# Patient Record
Sex: Male | Born: 1945 | Race: White | Hispanic: No | Marital: Married | Smoking: Never smoker
Health system: Southern US, Community
[De-identification: ages and names within clinical notes are randomized; demographics above are authoritative.]

## PROBLEM LIST (undated history)

## (undated) DIAGNOSIS — E785 Hyperlipidemia, unspecified: Secondary | ICD-10-CM

## (undated) DIAGNOSIS — E669 Obesity, unspecified: Secondary | ICD-10-CM

## (undated) DIAGNOSIS — L03115 Cellulitis of right lower limb: Secondary | ICD-10-CM

## (undated) DIAGNOSIS — L02415 Cutaneous abscess of right lower limb: Secondary | ICD-10-CM

## (undated) DIAGNOSIS — I4891 Unspecified atrial fibrillation: Secondary | ICD-10-CM

## (undated) DIAGNOSIS — I10 Essential (primary) hypertension: Secondary | ICD-10-CM

## (undated) DIAGNOSIS — D472 Monoclonal gammopathy: Secondary | ICD-10-CM

## (undated) DIAGNOSIS — E1169 Type 2 diabetes mellitus with other specified complication: Secondary | ICD-10-CM

## (undated) HISTORY — PX: NASAL FRACTURE SURGERY: SHX718

## (undated) HISTORY — PX: APPENDECTOMY: SHX54

---

## 1998-02-27 HISTORY — PX: NEPHRECTOMY: SHX65

## 2014-02-15 ENCOUNTER — Emergency Department: Payer: Self-pay | Admitting: Student

## 2018-12-16 DIAGNOSIS — K2270 Barrett's esophagus without dysplasia: Secondary | ICD-10-CM | POA: Diagnosis not present

## 2019-10-09 DIAGNOSIS — D6859 Other primary thrombophilia: Secondary | ICD-10-CM | POA: Diagnosis not present

## 2019-10-09 DIAGNOSIS — G44319 Acute post-traumatic headache, not intractable: Secondary | ICD-10-CM | POA: Diagnosis not present

## 2019-10-09 DIAGNOSIS — E1122 Type 2 diabetes mellitus with diabetic chronic kidney disease: Secondary | ICD-10-CM | POA: Diagnosis not present

## 2019-12-10 ENCOUNTER — Other Ambulatory Visit: Payer: Self-pay

## 2019-12-10 ENCOUNTER — Observation Stay (HOSPITAL_COMMUNITY)
Admission: EM | Admit: 2019-12-10 | Discharge: 2019-12-12 | Disposition: A | Discharge status: Home / Self Care | Payer: No Typology Code available for payment source | Attending: Internal Medicine | Admitting: Internal Medicine

## 2019-12-10 DIAGNOSIS — E119 Type 2 diabetes mellitus without complications: Secondary | ICD-10-CM | POA: Diagnosis not present

## 2019-12-10 DIAGNOSIS — Z794 Long term (current) use of insulin: Secondary | ICD-10-CM | POA: Insufficient documentation

## 2019-12-10 DIAGNOSIS — R7402 Elevation of levels of lactic acid dehydrogenase (LDH): Secondary | ICD-10-CM | POA: Diagnosis not present

## 2019-12-10 DIAGNOSIS — Z20822 Contact with and (suspected) exposure to covid-19: Secondary | ICD-10-CM | POA: Insufficient documentation

## 2019-12-10 DIAGNOSIS — I130 Hypertensive heart and chronic kidney disease with heart failure and stage 1 through stage 4 chronic kidney disease, or unspecified chronic kidney disease: Secondary | ICD-10-CM | POA: Diagnosis not present

## 2019-12-10 DIAGNOSIS — L03115 Cellulitis of right lower limb: Principal | ICD-10-CM | POA: Insufficient documentation

## 2019-12-10 DIAGNOSIS — Z79899 Other long term (current) drug therapy: Secondary | ICD-10-CM | POA: Insufficient documentation

## 2019-12-10 DIAGNOSIS — E669 Obesity, unspecified: Secondary | ICD-10-CM | POA: Diagnosis present

## 2019-12-10 DIAGNOSIS — E872 Acidosis, unspecified: Secondary | ICD-10-CM | POA: Diagnosis present

## 2019-12-10 DIAGNOSIS — Z7901 Long term (current) use of anticoagulants: Secondary | ICD-10-CM | POA: Insufficient documentation

## 2019-12-10 DIAGNOSIS — R7989 Other specified abnormal findings of blood chemistry: Secondary | ICD-10-CM

## 2019-12-10 DIAGNOSIS — I4891 Unspecified atrial fibrillation: Secondary | ICD-10-CM | POA: Insufficient documentation

## 2019-12-10 DIAGNOSIS — L02415 Cutaneous abscess of right lower limb: Secondary | ICD-10-CM | POA: Diagnosis present

## 2019-12-10 DIAGNOSIS — N1832 Chronic kidney disease, stage 3b: Secondary | ICD-10-CM | POA: Diagnosis not present

## 2019-12-10 DIAGNOSIS — I5030 Unspecified diastolic (congestive) heart failure: Secondary | ICD-10-CM | POA: Diagnosis not present

## 2019-12-10 DIAGNOSIS — I872 Venous insufficiency (chronic) (peripheral): Secondary | ICD-10-CM | POA: Diagnosis not present

## 2019-12-10 DIAGNOSIS — D472 Monoclonal gammopathy: Secondary | ICD-10-CM | POA: Diagnosis present

## 2019-12-10 DIAGNOSIS — E1169 Type 2 diabetes mellitus with other specified complication: Secondary | ICD-10-CM | POA: Diagnosis present

## 2019-12-10 DIAGNOSIS — I10 Essential (primary) hypertension: Secondary | ICD-10-CM | POA: Diagnosis present

## 2019-12-10 DIAGNOSIS — E785 Hyperlipidemia, unspecified: Secondary | ICD-10-CM | POA: Diagnosis present

## 2019-12-10 DIAGNOSIS — Z6841 Body Mass Index (BMI) 40.0 and over, adult: Secondary | ICD-10-CM

## 2019-12-10 HISTORY — DX: Type 2 diabetes mellitus with other specified complication: E11.69

## 2019-12-10 HISTORY — DX: Monoclonal gammopathy: D47.2

## 2019-12-10 HISTORY — DX: Unspecified atrial fibrillation: I48.91

## 2019-12-10 HISTORY — DX: Morbid (severe) obesity due to excess calories: E66.01

## 2019-12-10 HISTORY — DX: Essential (primary) hypertension: I10

## 2019-12-10 HISTORY — DX: Hyperlipidemia, unspecified: E78.5

## 2019-12-10 HISTORY — DX: Obesity, unspecified: E66.9

## 2019-12-10 HISTORY — DX: Cellulitis of right lower limb: L03.115

## 2019-12-10 HISTORY — DX: Cutaneous abscess of right lower limb: L02.415

## 2019-12-10 LAB — COMPREHENSIVE METABOLIC PANEL
ALT: 20 U/L (ref 0–44)
AST: 26 U/L (ref 15–41)
Albumin: 3.1 g/dL — ABNORMAL LOW (ref 3.5–5.0)
Alkaline Phosphatase: 127 U/L — ABNORMAL HIGH (ref 38–126)
Anion gap: 11 (ref 5–15)
BUN: 22 mg/dL (ref 8–23)
CO2: 23 mmol/L (ref 22–32)
Calcium: 8.7 mg/dL — ABNORMAL LOW (ref 8.9–10.3)
Chloride: 101 mmol/L (ref 98–111)
Creatinine, Ser: 1.83 mg/dL — ABNORMAL HIGH (ref 0.61–1.24)
GFR, Estimated: 35 mL/min — ABNORMAL LOW (ref >60)
Glucose, Bld: 204 mg/dL — ABNORMAL HIGH (ref 70–99)
Potassium: 4.8 mmol/L (ref 3.5–5.1)
Sodium: 135 mmol/L (ref 135–145)
Total Bilirubin: 0.8 mg/dL (ref 0.3–1.2)
Total Protein: 6.8 g/dL (ref 6.5–8.1)

## 2019-12-10 LAB — CBC WITH DIFFERENTIAL/PLATELET
Abs Immature Granulocytes: 0.04 10*3/uL (ref 0.00–0.07)
Basophils Absolute: 0.1 10*3/uL (ref 0.0–0.1)
Basophils Relative: 1 %
Eosinophils Absolute: 0.3 10*3/uL (ref 0.0–0.5)
Eosinophils Relative: 3 %
HCT: 37.1 % — ABNORMAL LOW (ref 39.0–52.0)
Hemoglobin: 11.5 g/dL — ABNORMAL LOW (ref 13.0–17.0)
Immature Granulocytes: 1 %
Lymphocytes Relative: 11 %
Lymphs Abs: 0.9 10*3/uL (ref 0.7–4.0)
MCH: 29 pg (ref 26.0–34.0)
MCHC: 31 g/dL (ref 30.0–36.0)
MCV: 93.7 fL (ref 80.0–100.0)
Monocytes Absolute: 0.5 10*3/uL (ref 0.1–1.0)
Monocytes Relative: 5 %
Neutro Abs: 6.9 10*3/uL (ref 1.7–7.7)
Neutrophils Relative %: 79 %
Platelets: 187 10*3/uL (ref 150–400)
RBC: 3.96 MIL/uL — ABNORMAL LOW (ref 4.22–5.81)
RDW: 17.2 % — ABNORMAL HIGH (ref 11.5–15.5)
WBC: 8.7 10*3/uL (ref 4.0–10.5)
nRBC: 0 % (ref 0.0–0.2)

## 2019-12-10 LAB — URINALYSIS, ROUTINE W REFLEX MICROSCOPIC
Bilirubin Urine: NEGATIVE
Glucose, UA: NEGATIVE mg/dL
Hgb urine dipstick: NEGATIVE
Ketones, ur: NEGATIVE mg/dL
Nitrite: NEGATIVE
Protein, ur: NEGATIVE mg/dL
Specific Gravity, Urine: 1.006 (ref 1.005–1.030)
pH: 5 (ref 5.0–8.0)

## 2019-12-10 LAB — CBG MONITORING, ED: Glucose-Capillary: 176 mg/dL — ABNORMAL HIGH (ref 70–99)

## 2019-12-10 LAB — LACTIC ACID, PLASMA: Lactic Acid, Venous: 2.4 mmol/L (ref 0.5–1.9)

## 2019-12-10 NOTE — ED Triage Notes (Signed)
Pt reports swelling, redness, and pain to R leg since yesterday. Sent here by the [facility] for further eval of infection to same. Arrives with gauze wrap to LLE.

## 2019-12-11 ENCOUNTER — Encounter (HOSPITAL_COMMUNITY): Payer: Self-pay | Admitting: Internal Medicine

## 2019-12-11 ENCOUNTER — Emergency Department (HOSPITAL_COMMUNITY): Payer: No Typology Code available for payment source

## 2019-12-11 ENCOUNTER — Observation Stay (HOSPITAL_BASED_OUTPATIENT_CLINIC_OR_DEPARTMENT_OTHER): Payer: No Typology Code available for payment source

## 2019-12-11 DIAGNOSIS — L02415 Cutaneous abscess of right lower limb: Secondary | ICD-10-CM | POA: Diagnosis present

## 2019-12-11 DIAGNOSIS — E66813 Obesity, class 3: Secondary | ICD-10-CM

## 2019-12-11 DIAGNOSIS — L03115 Cellulitis of right lower limb: Secondary | ICD-10-CM | POA: Diagnosis not present

## 2019-12-11 DIAGNOSIS — E1169 Type 2 diabetes mellitus with other specified complication: Secondary | ICD-10-CM | POA: Diagnosis present

## 2019-12-11 DIAGNOSIS — Z6841 Body Mass Index (BMI) 40.0 and over, adult: Secondary | ICD-10-CM

## 2019-12-11 DIAGNOSIS — I739 Peripheral vascular disease, unspecified: Secondary | ICD-10-CM | POA: Diagnosis not present

## 2019-12-11 DIAGNOSIS — E872 Acidosis, unspecified: Secondary | ICD-10-CM | POA: Diagnosis present

## 2019-12-11 DIAGNOSIS — D472 Monoclonal gammopathy: Secondary | ICD-10-CM | POA: Diagnosis present

## 2019-12-11 DIAGNOSIS — E785 Hyperlipidemia, unspecified: Secondary | ICD-10-CM | POA: Diagnosis present

## 2019-12-11 DIAGNOSIS — I4891 Unspecified atrial fibrillation: Secondary | ICD-10-CM | POA: Diagnosis present

## 2019-12-11 DIAGNOSIS — I10 Essential (primary) hypertension: Secondary | ICD-10-CM | POA: Diagnosis present

## 2019-12-11 DIAGNOSIS — I872 Venous insufficiency (chronic) (peripheral): Secondary | ICD-10-CM | POA: Diagnosis present

## 2019-12-11 DIAGNOSIS — N1832 Chronic kidney disease, stage 3b: Secondary | ICD-10-CM

## 2019-12-11 HISTORY — DX: Chronic kidney disease, stage 3b: N18.32

## 2019-12-11 LAB — CBG MONITORING, ED: Glucose-Capillary: 203 mg/dL — ABNORMAL HIGH (ref 70–99)

## 2019-12-11 LAB — PROTIME-INR
INR: 2.5 — ABNORMAL HIGH (ref 0.8–1.2)
Prothrombin Time: 26.3 seconds — ABNORMAL HIGH (ref 11.4–15.2)

## 2019-12-11 LAB — RESPIRATORY PANEL BY RT PCR (FLU A&B, COVID)
Influenza A by PCR: NEGATIVE
Influenza B by PCR: NEGATIVE
SARS Coronavirus 2 by RT PCR: NEGATIVE

## 2019-12-11 LAB — GLUCOSE, CAPILLARY: Glucose-Capillary: 208 mg/dL — ABNORMAL HIGH (ref 70–99)

## 2019-12-11 LAB — LACTIC ACID, PLASMA
Lactic Acid, Venous: 4.9 mmol/L (ref 0.5–1.9)
Lactic Acid, Venous: 2.9 mmol/L (ref 0.5–1.9)

## 2019-12-11 MED ORDER — ACETAMINOPHEN 650 MG RE SUPP: 650.0000 mg | Freq: Four times a day (QID) | RECTAL | Status: DC | PRN

## 2019-12-11 MED ORDER — POLYVINYL ALCOHOL 1.4 % OP SOLN
1.0000 [drp] | Freq: Three times a day (TID) | OPHTHALMIC | Status: DC
  Administered 2019-12-12: 1 [drp] via OPHTHALMIC
  Filled 2019-12-11 (×2): qty 15

## 2019-12-11 MED ORDER — FINASTERIDE 5 MG PO TABS
5.0000 mg | ORAL_TABLET | Freq: Every day | ORAL | Status: DC
  Administered 2019-12-11 – 2019-12-12 (×2): 5 mg via ORAL
  Filled 2019-12-11 (×2): qty 1

## 2019-12-11 MED ORDER — CEPHALEXIN 500 MG PO CAPS: 500.0000 mg | ORAL_CAPSULE | Freq: Four times a day (QID) | ORAL | 0 refills | Status: DC

## 2019-12-11 MED ORDER — DOCUSATE SODIUM 100 MG PO CAPS
100.0000 mg | ORAL_CAPSULE | Freq: Two times a day (BID) | ORAL | Status: DC
  Administered 2019-12-11 – 2019-12-12 (×3): 100 mg via ORAL
  Filled 2019-12-11 (×3): qty 1

## 2019-12-11 MED ORDER — HYDRALAZINE HCL 20 MG/ML IJ SOLN: 5.0000 mg | INTRAMUSCULAR | Status: DC | PRN

## 2019-12-11 MED ORDER — ENOXAPARIN SODIUM 40 MG/0.4ML ~~LOC~~ SOLN: 40.0000 mg | SUBCUTANEOUS | Status: DC

## 2019-12-11 MED ORDER — MORPHINE SULFATE (PF) 2 MG/ML IV SOLN: 2.0000 mg | INTRAVENOUS | Status: DC | PRN

## 2019-12-11 MED ORDER — INSULIN ASPART PROT & ASPART (70-30 MIX) 100 UNIT/ML PEN
95.0000 [IU] | PEN_INJECTOR | Freq: Two times a day (BID) | SUBCUTANEOUS | Status: DC
  Filled 2019-12-11 (×2): qty 3

## 2019-12-11 MED ORDER — TRAMADOL HCL 50 MG PO TABS
50.0000 mg | ORAL_TABLET | Freq: Every day | ORAL | Status: DC
  Administered 2019-12-11 – 2019-12-12 (×2): 50 mg via ORAL
  Filled 2019-12-11 (×2): qty 1

## 2019-12-11 MED ORDER — METOPROLOL SUCCINATE ER 100 MG PO TB24
100.0000 mg | ORAL_TABLET | Freq: Every day | ORAL | Status: DC
  Administered 2019-12-11 – 2019-12-12 (×2): 100 mg via ORAL
  Filled 2019-12-11 (×2): qty 1

## 2019-12-11 MED ORDER — INSULIN ASPART 100 UNIT/ML ~~LOC~~ SOLN
0.0000 [IU] | Freq: Three times a day (TID) | SUBCUTANEOUS | Status: DC
  Administered 2019-12-11 – 2019-12-12 (×2): 7 [IU] via SUBCUTANEOUS
  Administered 2019-12-12: 4 [IU] via SUBCUTANEOUS

## 2019-12-11 MED ORDER — ALLOPURINOL 100 MG PO TABS
100.0000 mg | ORAL_TABLET | Freq: Two times a day (BID) | ORAL | Status: DC
  Administered 2019-12-11 – 2019-12-12 (×3): 100 mg via ORAL
  Filled 2019-12-11 (×4): qty 1

## 2019-12-11 MED ORDER — WARFARIN - PHARMACIST DOSING INPATIENT: Freq: Every day | Status: DC

## 2019-12-11 MED ORDER — CEPHALEXIN 250 MG PO CAPS: 500.0000 mg | ORAL_CAPSULE | Freq: Once | ORAL | Status: DC

## 2019-12-11 MED ORDER — LACTATED RINGERS IV SOLN: INTRAVENOUS | Status: DC

## 2019-12-11 MED ORDER — HYDROXYZINE HCL 50 MG PO TABS
100.0000 mg | ORAL_TABLET | ORAL | Status: DC | PRN
  Filled 2019-12-11: qty 2

## 2019-12-11 MED ORDER — AMIODARONE HCL 200 MG PO TABS
200.0000 mg | ORAL_TABLET | Freq: Every day | ORAL | Status: DC
  Administered 2019-12-11: 200 mg via ORAL
  Filled 2019-12-11: qty 1

## 2019-12-11 MED ORDER — SODIUM CHLORIDE 0.9 % IV BOLUS
1000.0000 mL | Freq: Once | INTRAVENOUS | Status: AC
  Administered 2019-12-11: 1000 mL via INTRAVENOUS

## 2019-12-11 MED ORDER — ONDANSETRON HCL 4 MG/2ML IJ SOLN: 4.0000 mg | Freq: Four times a day (QID) | INTRAMUSCULAR | Status: DC | PRN

## 2019-12-11 MED ORDER — WARFARIN SODIUM 5 MG PO TABS
5.0000 mg | ORAL_TABLET | Freq: Once | ORAL | Status: AC
  Administered 2019-12-11: 5 mg via ORAL
  Filled 2019-12-11: qty 1

## 2019-12-11 MED ORDER — ACETAMINOPHEN 325 MG PO TABS
650.0000 mg | ORAL_TABLET | Freq: Four times a day (QID) | ORAL | Status: DC | PRN
  Administered 2019-12-11: 650 mg via ORAL
  Filled 2019-12-11: qty 2

## 2019-12-11 MED ORDER — INSULIN ASPART 100 UNIT/ML ~~LOC~~ SOLN
0.0000 [IU] | Freq: Every day | SUBCUTANEOUS | Status: DC
  Administered 2019-12-11: 2 [IU] via SUBCUTANEOUS

## 2019-12-11 MED ORDER — OXYBUTYNIN CHLORIDE 5 MG PO TABS
5.0000 mg | ORAL_TABLET | Freq: Every day | ORAL | Status: DC
  Administered 2019-12-11 – 2019-12-12 (×2): 5 mg via ORAL
  Filled 2019-12-11 (×2): qty 1

## 2019-12-11 MED ORDER — CITALOPRAM HYDROBROMIDE 20 MG PO TABS
10.0000 mg | ORAL_TABLET | Freq: Every day | ORAL | Status: DC
  Administered 2019-12-11 – 2019-12-12 (×2): 10 mg via ORAL
  Filled 2019-12-11 (×2): qty 1

## 2019-12-11 MED ORDER — LOSARTAN POTASSIUM 25 MG PO TABS
25.0000 mg | ORAL_TABLET | Freq: Every day | ORAL | Status: DC
  Administered 2019-12-11: 25 mg via ORAL
  Filled 2019-12-11: qty 1

## 2019-12-11 MED ORDER — SODIUM CHLORIDE 0.9 % IV SOLN
1000.0000 mL | INTRAVENOUS | Status: DC
  Administered 2019-12-11 (×2): 1000 mL via INTRAVENOUS

## 2019-12-11 MED ORDER — ATORVASTATIN CALCIUM 80 MG PO TABS
80.0000 mg | ORAL_TABLET | Freq: Every day | ORAL | Status: DC
  Administered 2019-12-11: 80 mg via ORAL
  Filled 2019-12-11: qty 1

## 2019-12-11 MED ORDER — LORATADINE 10 MG PO TABS
10.0000 mg | ORAL_TABLET | Freq: Every day | ORAL | Status: DC
  Administered 2019-12-11 – 2019-12-12 (×2): 10 mg via ORAL
  Filled 2019-12-11 (×2): qty 1

## 2019-12-11 MED ORDER — ONDANSETRON HCL 4 MG PO TABS: 4.0000 mg | ORAL_TABLET | Freq: Four times a day (QID) | ORAL | Status: DC | PRN

## 2019-12-11 MED ORDER — SODIUM CHLORIDE 0.9 % IV SOLN
2.0000 g | INTRAVENOUS | Status: DC
  Administered 2019-12-11 – 2019-12-12 (×2): 2 g via INTRAVENOUS
  Filled 2019-12-11 (×2): qty 20

## 2019-12-11 MED ORDER — GABAPENTIN 400 MG PO CAPS
400.0000 mg | ORAL_CAPSULE | Freq: Every day | ORAL | Status: DC
  Administered 2019-12-11: 400 mg via ORAL
  Filled 2019-12-11: qty 1

## 2019-12-11 MED ORDER — INSULIN ASPART PROT & ASPART (70-30 MIX) 100 UNIT/ML ~~LOC~~ SUSP
95.0000 [IU] | Freq: Two times a day (BID) | SUBCUTANEOUS | Status: DC
  Administered 2019-12-12: 95 [IU] via SUBCUTANEOUS
  Filled 2019-12-11: qty 10

## 2019-12-11 MED ORDER — BISACODYL 5 MG PO TBEC: 5.0000 mg | DELAYED_RELEASE_TABLET | Freq: Every day | ORAL | Status: DC | PRN

## 2019-12-11 MED ORDER — POLYETHYLENE GLYCOL 3350 17 G PO PACK: 17.0000 g | PACK | Freq: Every day | ORAL | Status: DC | PRN

## 2019-12-11 MED ORDER — SODIUM CHLORIDE 0.9 % IV BOLUS (SEPSIS)
1000.0000 mL | Freq: Once | INTRAVENOUS | Status: AC
  Administered 2019-12-11: 1000 mL via INTRAVENOUS

## 2019-12-11 MED ORDER — TERAZOSIN HCL 5 MG PO CAPS
10.0000 mg | ORAL_CAPSULE | Freq: Every day | ORAL | Status: DC
  Administered 2019-12-11: 10 mg via ORAL
  Filled 2019-12-11 (×2): qty 2

## 2019-12-11 MED ORDER — HYDROCODONE-ACETAMINOPHEN 5-325 MG PO TABS: 1.0000 | ORAL_TABLET | ORAL | Status: DC | PRN

## 2019-12-11 NOTE — Progress Notes (Signed)
Notified provider of need to administer fluid bolus, pt needs 4311 cc.

## 2019-12-11 NOTE — Consult Note (Addendum)
[place] Nurse wound consult note Patient receiving care in [facility] ED [place] Consultation was completed remotely by review of records, and images and secure chat with Dr. Lorin Mercy Reason for Consult: B stasis dermatitis with RLE cellulitis Wound type: Venous insuffiency with bilateral LE hemosiderin staining. RLE is more severely impacted by venous insufficiency with scattered open areas and now with cellulitis Dressing procedure/placement/frequency: Clean RLE with soap and water, rinse and pat dry. Apply Xeroform Kellie Simmering # [id number]) to the open areas, wrap with kerlix then cover from toe to just below the knee with Ace Wrap Kellie Simmering # [phone]) Monitor the wound area(s) for worsening of condition such as: Signs/symptoms of infection, increase in size, development of or worsening of odor, development of pain, or increased pain at the affected locations.   Notify the medical team if any of these develop.  Thank you for the consult. [place] nurse will follow at this time. ABI study results to be obtained . [place] team will review when they are available. Please re-consult the [place] team if needed.  Cathlean Marseilles Tamala Julian, MSN, RN, [place], Lysle Pearl, [facility] Wound Treatment Associate Pager [phone]

## 2019-12-11 NOTE — Progress Notes (Signed)
Per ED MD giving fluids for ideal body weight

## 2019-12-11 NOTE — Progress Notes (Signed)
Notified bedside nurse of need to draw repeat lactic acid. 

## 2019-12-11 NOTE — Discharge Planning (Signed)
Pt active at [facility] MD: Scharlene Corn: Glean Hess   Pager: [phone] Desk phone: [phone]

## 2019-12-11 NOTE — H&P (Signed)
History and Physical    Alfred Meyers IOE:[id number] DOB: [birth year] DOA: 12/10/2019  PCP: [facility] Consultants:  [place]; Felton Clinton - oncology; Rayann Heman D for Coumadin; nephrology Patient coming from:  Home - lives with wife; NOK: Wife, [phone]  Chief Complaint: R leg cellulitis  HPI: Alfred Meyers is a 74 y.o. male with medical history significant of MGUS; HLD; depression; BPH; DM; HTN; and afib on Coumadin presenting with R leg cellulitis.  He came because of his right leg being infected. Symptoms started about 2 days ago.  No known trauma.  No fever.  +HA.  No h/o prior.    ED Course:  Carryover, per Dr. Tonie Griffith:  74 yo male with hx of DM, CHF, a-fib. Presents with cellulitis of leg. Lactic acid doubled on recheck and is 4.9. no signs of necrotizing fascitis on xray per ER doctor. Given Rocephin in ER.    Review of Systems: As per HPI; otherwise review of systems reviewed and negative.   Ambulatory Status:  Ambulates with a cane  COVID Vaccine Status:   Complete  Past Medical History:  Diagnosis Date  . Atrial fibrillation ([place])   . Cellulitis and abscess of right leg 10-/15/2021  . Diabetes mellitus type 2 in obese ([place])   . Dyslipidemia   . Hypertension   . MGUS (monoclonal gammopathy of unknown significance)   . Morbid obesity with BMI of 45.0-49.9, adult ([place])   . Stage 3b chronic kidney disease ([place]) 12/11/2019    Past Surgical History:  Procedure Laterality Date  . APPENDECTOMY    . NASAL FRACTURE SURGERY    . NEPHRECTOMY  2000   RCC    Social History   Socioeconomic History  . Marital status: Married    Spouse name: Not on file  . Number of children: Not on file  . Years of education: Not on file  . Highest education level: Not on file  Occupational History  . Not on file  Tobacco Use  . Smoking status: Never Smoker  . Smokeless tobacco: Never Used  Substance and Sexual Activity  . Alcohol use: Never  . Drug use: Never   . Sexual activity: Not on file  Other Topics Concern  . Not on file  Social History Narrative  . Not on file   Social Determinants of Health   Financial Resource Strain:   . Difficulty of Paying Living Expenses: Not on file  Food Insecurity:   . Worried About Charity fundraiser in the Last Year: Not on file  . Ran Out of Food in the Last Year: Not on file  Transportation Needs:   . Lack of Transportation (Medical): Not on file  . Lack of Transportation (Non-Medical): Not on file  Physical Activity:   . Days of Exercise per Week: Not on file  . Minutes of Exercise per Session: Not on file  Stress:   . Feeling of Stress : Not on file  Social Connections:   . Frequency of Communication with Friends and Family: Not on file  . Frequency of Social Gatherings with Friends and Family: Not on file  . Attends Religious Services: Not on file  . Active Member of Clubs or Organizations: Not on file  . Attends Archivist Meetings: Not on file  . Marital Status: Not on file  Intimate Partner Violence:   . Fear of Current or Ex-Partner: Not on file  . Emotionally Abused: Not on file  .  Physically Abused: Not on file  . Sexually Abused: Not on file    No Known Allergies  History reviewed. No pertinent family history.  Prior to Admission medications   Medication Sig Start Date End Date Taking? Authorizing Provider  cephALEXin (KEFLEX) 500 MG capsule Take 1 capsule (500 mg total) by mouth 4 (four) times daily for 10 days. 12/11/19 12/21/19  Fatima Blank, MD    Physical Exam: Vitals:   12/10/19 2244 12/11/19 0049 12/11/19 0230 12/11/19 0733  BP: (!) 167/70 (!) 160/54 (!) 156/69 133/67  Pulse: 79 88 79 77  Resp: 20 20  19   Temp: 97.7 F (36.5 C)   98.3 F (36.8 C)  TempSrc:    Oral  SpO2: 100% 98% 100% 98%  Weight:      Height:         . General:  Appears calm and comfortable and is NAD . Eyes:  PERRL, EOMI, normal lids, iris . ENT:  grossly normal  hearing, lips & tongue, mmm; poor dentition . Neck:  no LAD, masses or thyromegaly . Cardiovascular:  RRR, no m/r/g.  . Respiratory:   CTA bilaterally with no wheezes/rales/rhonchi.  Normal respiratory effort. . Abdomen:  soft, NT, ND, NABS, protuberant . Skin:  Hyperpigmentation of B LE with apparent B stasis dermatitis but the RLE is more erythematous and weeping purulent material on the medial-posterior side        . Musculoskeletal:  grossly normal tone BUE/BLE, good ROM, no bony abnormality . Psychiatric:  grossly normal mood and affect, speech fluent and appropriate, AOx3 Neurologic:  CN 2-12 grossly intact, moves all extremities in coordinated fashion   Radiological Exams on Admission: DG Tibia/Fibula Right  Result Date: 12/11/2019 CLINICAL DATA:  Cellulitis. EXAM: RIGHT TIBIA AND FIBULA - 2 VIEW COMPARISON:  None. FINDINGS: Diffuse edematous changes are present throughout the soft tissues lower extremity. Skin thickening is evident. Degenerative changes are noted at the knee and ankle without acute or focal osseous abnormalities otherwise. IMPRESSION: 1. Diffuse edematous changes throughout the soft tissues. 2. No acute or focal osseous abnormality. 3. Degenerative changes at the knee and ankle. Electronically Signed   By: San Morelle M.D.   On: 12/11/2019 05:31    EKG: not done   Labs on Admission: I have personally reviewed the available labs and imaging studies at the time of the admission.  Pertinent labs:   Glucose 204 BUN 22/Creatinine 1.83/GFR 35 - improved from 10/09/19 Lactate 2.4 -> 4.9 WBC 8.7 Hgb 11.5 INR 2.5 UA: small LE, rare bacteria   Assessment/Plan Principal Problem:   Cellulitis and abscess of right leg Active Problems:   Morbid obesity with BMI of 45.0-49.9, adult (HCC)   MGUS (monoclonal gammopathy of unknown significance)   Hypertension   Dyslipidemia   Diabetes mellitus type 2 in obese (HCC)   Atrial fibrillation (HCC)   Lactic  acidosis   Stasis dermatitis of both legs   Stage 3b chronic kidney disease ([place])    RLE Cellulitis -Patient without prior h/o cellulitis but obviously chronic B venous stasis with dermatitis presenting with worsening erythema to R lower leg with skin breakdown and weeping of purulent drainage -Normal WBC count -He was given Rocephin in the ER; will continue -If improved tomorrow, he likely can be discharged  B LE stasis dermatitis -Patient's wife has been wrapping both of his legs nightly with gauze and then ACE wraps -He would likely benefit from Unna boots to B LE but not for the  RLE until his cellulitis is improved -Wound care consult requested -ABIs ordered  Lactic acidosis -The patient is noted to have a lactate>4 (initial was lower).  -With the current information available to me, I don't think the patient is in septic shock.  -He has no SIRS criteria. -There is no evidence of necrotizing fasciitis on xray. -Will continue to trend the lactate to ensure clearance. -If continuing to increase, will consult surgery.  MGUS -Appears to have had abnormal bone marrow biopsy in 2019 -There are no obvious clinical notes from Dr. 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 -Weight loss should be encouraged -Outpatient PCP/bariatric medicine/bariatric surgery  f/u encouraged     Note: This patient has been tested and is negative for the novel coronavirus COVID-19. The patient has been fully vaccinated against COVID-19.    DVT prophylaxis:   Lovenox  Code Status:  Full - confirmed with patient/family Family Communication: Wife was present throughout evaluation Disposition Plan:  The patient is from: home  Anticipated d/c is to: home without [facility] services   Anticipated d/c date will depend on clinical response to treatment, but possibly as early as tomorrow if he has excellent response to treatment  Patient is currently: acutely ill Consults called: Wound care, Nutrition, TOC team Admission status:  It is my clinical opinion that referral for OBSERVATION is reasonable and necessary in this patient based on the above information provided. The aforementioned taken together are felt to place the patient at high risk for further clinical deterioration. However it is anticipated that the patient may be medically stable for discharge from the hospital within 24 to 48 hours.    Karmen Bongo MD [facility]   How to contact the [facility] Attending or Consulting provider [place] or covering provider during after hours [place], for this patient?  1. Check the care team in [facility] and look for a) attending/consulting TRH provider listed and b) the [facility] team listed 2. Log into [email or web address] and use 's universal password to access. If you do not have the password, please contact the hospital operator. 3. Locate the [facility] provider you are looking for under [facility] and page to a number that you can be directly reached. 4. If you still have difficulty reaching the provider, please page the [facility] (Director on Call) for the Hospitalists listed on amion for assistance.   12/11/2019, 9:44 AM

## 2019-12-11 NOTE — ED Notes (Signed)
Attempt report x1  

## 2019-12-11 NOTE — Progress Notes (Signed)
[place] for warfarin Indication: atrial fibrillation   Assessment: 84 yom with history of afib on warfarin PTA presenting with R leg cellulitis and started on ceftriaxone. Pharmacy consulted to dose warfarin inpatient. INR therapeutic at 2.5 on admit. Hg 11.5, plt wnl. No active bleed issues reported. Noted, patient continuing on PTA amiodarone inpatient. Lovenox prophylaxis ordered - not yet given.  PTA warfarin dose: 5mg  daily except 7mg  on Mon/Fri (last dose "past week" PTA per med rec)  Goal of Therapy:  INR 2-3 Monitor platelets by anticoagulation protocol: Yes   Plan:  D/c Lovenox prophylaxis with therapeutic INR Warfarin 5mg  PO x 1 dose Daily INR Monitor CBC, s/sx bleeding  Elicia Lamp, PharmD, BCPS Clinical Pharmacist 12/11/2019 10:29 AM

## 2019-12-11 NOTE — ED Provider Notes (Addendum)
[facility] EMERGENCY DEPARTMENT Provider Note  CSN: [id number] Arrival date & time: 12/10/19 1550  Chief Complaint(s) Cellulitis  HPI Alfred Meyers is a 74 y.o. male with a past medical history including hypertension, hyperlipidemia, diabetes, A. fib on Coumadin, diastolic heart failure on Lasix who presents to the emergency department with 2 days of gradually worsening right lower leg redness and pain.  Patient reports that he wraps his legs and unna boots due to his edema.  When he unwrapped his legs Tuesday he noted the redness of the right leg.  This is gradually worsened.  He went to the [place] who sent him here for further evaluation.  The patient denies any recent fevers or infections.  No coughing or congestion.  No chest pain or shortness of breath.  No other physical complaints.  HPI  Past Medical History No past medical history on file. There are no problems to display for this patient.  Home Medication(s)                                                                                   Past Surgical History ** The histories are not reviewed yet. Please review them in the "History" navigator section and refresh this [place]. Family History No family history on file.  Social History Social History   Tobacco Use  . Smoking status: Not on file  Substance Use Topics  . Alcohol use: Not on file  . Drug use: Not on file   Allergies Patient has no known allergies.  Review of Systems Review of Systems All other systems are reviewed and are negative for acute change except as noted in the HPI  Physical Exam Vital Signs  I have reviewed the triage vital signs BP (!) 156/69   Pulse 79   Temp 97.7 F (36.5 C)   Resp 20   Ht 5\' 9"  (1.753 m)   Wt (!) 144.7 kg   SpO2 100%   BMI 47.11 kg/m   Physical Exam Vitals reviewed.  Constitutional:      General: He is not in acute distress.    Appearance: He is well-developed. He is morbidly obese. He is  not diaphoretic.  HENT:     Head: Normocephalic and atraumatic.     Jaw: No trismus.     Right Ear: External ear normal.     Left Ear: External ear normal.     Nose: Nose normal.  Eyes:     General: No scleral icterus.    Conjunctiva/sclera: Conjunctivae normal.  Neck:     Trachea: Phonation normal.  Cardiovascular:     Rate and Rhythm: Normal rate and regular rhythm.  Pulmonary:     Effort: Pulmonary effort is normal. No respiratory distress.     Breath sounds: No stridor.  Abdominal:     General: There is no distension.  Musculoskeletal:        General: Normal range of motion.     Cervical back: Normal range of motion.     Right lower leg: 1+ Pitting Edema present.     Left lower leg: 1+ Pitting Edema present.     Comments: Chronic skin changes  to lower extremities due to edema.  Right lower leg with erythema from just above the ankle to proximal lower leg.  Patient has weeping and skin breakdown.  Area is tender to palpation.  See image  Neurological:     Mental Status: He is alert and oriented to person, place, and time.  Psychiatric:        Behavior: Behavior normal.           ED Results and Treatments Labs (all labs ordered are listed, but only abnormal results are displayed) Labs Reviewed  LACTIC ACID, PLASMA - Abnormal; Notable for the following components:      Result Value   Lactic Acid, Venous 2.4 (*)    All other components within normal limits  LACTIC ACID, PLASMA - Abnormal; Notable for the following components:   Lactic Acid, Venous 4.9 (*)    All other components within normal limits  COMPREHENSIVE METABOLIC PANEL - Abnormal; Notable for the following components:   Glucose, Bld 204 (*)    Creatinine, Ser 1.83 (*)    Calcium 8.7 (*)    Albumin 3.1 (*)    Alkaline Phosphatase 127 (*)    GFR, Estimated 35 (*)    All other components within normal limits  CBC WITH DIFFERENTIAL/PLATELET - Abnormal; Notable for the following components:   RBC 3.96  (*)    Hemoglobin 11.5 (*)    HCT 37.1 (*)    RDW 17.2 (*)    All other components within normal limits  URINALYSIS, ROUTINE W REFLEX MICROSCOPIC - Abnormal; Notable for the following components:   Leukocytes,Ua SMALL (*)    Bacteria, UA RARE (*)    All other components within normal limits  PROTIME-INR - Abnormal; Notable for the following components:   Prothrombin Time 26.3 (*)    INR 2.5 (*)    All other components within normal limits  CBG MONITORING, ED - Abnormal; Notable for the following components:   Glucose-Capillary 176 (*)    All other components within normal limits  CULTURE, BLOOD (ROUTINE X 2)  CULTURE, BLOOD (ROUTINE X 2)  RESPIRATORY PANEL BY RT PCR (FLU A&B, COVID)  LACTIC ACID, PLASMA  LACTIC ACID, PLASMA                                                                                                                         EKG  EKG Interpretation  Date/Time:    Ventricular Rate:    PR Interval:    QRS Duration:   QT Interval:    QTC Calculation:   R Axis:     Text Interpretation:        Radiology DG Tibia/Fibula Right  Result Date: 12/11/2019 CLINICAL DATA:  Cellulitis. EXAM: RIGHT TIBIA AND FIBULA - 2 VIEW COMPARISON:  None. FINDINGS: Diffuse edematous changes are present throughout the soft tissues lower extremity. Skin thickening is evident. Degenerative changes are noted at the knee and ankle without acute or focal osseous abnormalities otherwise. IMPRESSION:  1. Diffuse edematous changes throughout the soft tissues. 2. No acute or focal osseous abnormality. 3. Degenerative changes at the knee and ankle. Electronically Signed   By: San Morelle M.D.   On: 12/11/2019 05:31    Pertinent labs & imaging results that were available during my care of the patient were reviewed by me and considered in my medical decision making (see chart for details).  Medications Ordered in ED Medications  lactated ringers infusion (has no administration in time  range)  cefTRIAXone (ROCEPHIN) 2 g in sodium chloride 0.9 % 100 mL IVPB (2 g Intravenous New Bag/Given 12/11/19 0505)  sodium chloride 0.9 % bolus 1,000 mL (has no administration in time range)    Followed by  0.9 %  sodium chloride infusion (has no administration in time range)  sodium chloride 0.9 % bolus 1,000 mL (1,000 mLs Intravenous New Bag/Given 12/11/19 0503)                                                                                                                                    Procedures .Critical Care Performed by: Fatima Blank, MD Authorized by: Fatima Blank, MD   Critical care provider statement:    Critical care time (minutes):  45   Critical care was time spent personally by me on the following activities:  Discussions with consultants, evaluation of patient's response to treatment, examination of patient, ordering and performing treatments and interventions, ordering and review of laboratory studies, ordering and review of radiographic studies, pulse oximetry, re-evaluation of patient's condition, obtaining history from patient or surrogate and review of old charts    (including critical care time)  Medical Decision Making / ED Course I have reviewed the nursing notes for this encounter and the patient's prior records (if available in EHR or on provided paperwork).   Alfred Meyers was evaluated in Emergency Department on 12/11/2019 for the symptoms described in the history of present illness. He was evaluated in the context of the global COVID-19 pandemic, which necessitated consideration that the patient might be at risk for infection with the SARS-CoV-2 virus that causes COVID-19. Institutional protocols and algorithms that pertain to the evaluation of patients at risk for COVID-19 are in a state of rapid change based on information released by regulatory bodies including the CDC and federal and state organizations. These policies and algorithms  were followed during the patient's care in the ED.  Patient presents with right lower extremity cellulitis/erysipelas. CBC without leukocytosis. Renal insufficiency improved from prior. Lactic acid is slightly elevated at 2.4.  On review of records, noted that his last INR from August was supratherapeutic above 6.  Repeated here and noted to be 2.5.  He is afebrile with stable vital signs.  Discussed admission for IV antibiotics but patient initially declined.  Repeated lactic acid trending up and now greater than 4.  Patient updated on this change and was agreeable  to being admitted for IV antibiotics. Providing ideal body wt IVF due to obesity. Lactic acid is nonspecific. He does have a source of infection, but his other SIRS markers are reassuring.   We will obtain a plain film of the lower extremity to assess for any subcutaneous air that would be concerning for necrotizing fasciitis.      Plain film not concerning for Nec Fasc.    Will admit to Hospitalist team for further management.       Final Clinical Impression(s) / ED Diagnoses Final diagnoses:  Cellulitis of right leg  Elevated lactic acid level      This chart was dictated using voice recognition software.  Despite best efforts to proofread,  errors can occur which can change the documentation meaning.     Fatima Blank, MD 12/11/19 [phone]

## 2019-12-11 NOTE — Sepsis Progress Note (Signed)
Notified provider of need to order repeat lactic acid. And also notified that patient is due to receive more IVF resuscitation if appropriate.

## 2019-12-12 DIAGNOSIS — L03115 Cellulitis of right lower limb: Secondary | ICD-10-CM | POA: Diagnosis not present

## 2019-12-12 LAB — BASIC METABOLIC PANEL
Anion gap: 10 (ref 5–15)
BUN: 15 mg/dL (ref 8–23)
CO2: 25 mmol/L (ref 22–32)
Calcium: 8.6 mg/dL — ABNORMAL LOW (ref 8.9–10.3)
Chloride: 101 mmol/L (ref 98–111)
Creatinine, Ser: 1.5 mg/dL — ABNORMAL HIGH (ref 0.61–1.24)
GFR, Estimated: 45 mL/min — ABNORMAL LOW (ref >60)
Glucose, Bld: 210 mg/dL — ABNORMAL HIGH (ref 70–99)
Potassium: 4.3 mmol/L (ref 3.5–5.1)
Sodium: 136 mmol/L (ref 135–145)

## 2019-12-12 LAB — CBC
HCT: 37.3 % — ABNORMAL LOW (ref 39.0–52.0)
Hemoglobin: 12 g/dL — ABNORMAL LOW (ref 13.0–17.0)
MCH: 30.1 pg (ref 26.0–34.0)
MCHC: 32.2 g/dL (ref 30.0–36.0)
MCV: 93.5 fL (ref 80.0–100.0)
Platelets: 205 10*3/uL (ref 150–400)
RBC: 3.99 MIL/uL — ABNORMAL LOW (ref 4.22–5.81)
RDW: 17.2 % — ABNORMAL HIGH (ref 11.5–15.5)
WBC: 9.5 10*3/uL (ref 4.0–10.5)
nRBC: 0 % (ref 0.0–0.2)

## 2019-12-12 LAB — PROTIME-INR
INR: 2.5 — ABNORMAL HIGH (ref 0.8–1.2)
Prothrombin Time: 25.8 seconds — ABNORMAL HIGH (ref 11.4–15.2)

## 2019-12-12 LAB — GLUCOSE, CAPILLARY
Glucose-Capillary: 215 mg/dL — ABNORMAL HIGH (ref 70–99)
Glucose-Capillary: 195 mg/dL — ABNORMAL HIGH (ref 70–99)

## 2019-12-12 LAB — HEMOGLOBIN A1C
Hgb A1c MFr Bld: 6.9 % — ABNORMAL HIGH (ref 4.8–5.6)
Mean Plasma Glucose: 151 mg/dL

## 2019-12-12 MED ORDER — CEPHALEXIN 500 MG PO CAPS: 500.0000 mg | ORAL_CAPSULE | Freq: Three times a day (TID) | ORAL | 0 refills | Status: DC

## 2019-12-12 MED ORDER — CEPHALEXIN 500 MG PO CAPS
500.0000 mg | ORAL_CAPSULE | Freq: Three times a day (TID) | ORAL | Status: DC
  Administered 2019-12-12: 500 mg via ORAL
  Filled 2019-12-12: qty 1

## 2019-12-12 MED ORDER — WARFARIN SODIUM 5 MG PO TABS: 5.0000 mg | ORAL_TABLET | Freq: Once | ORAL | Status: DC

## 2019-12-12 MED ORDER — CEPHALEXIN 500 MG PO CAPS: 500.0000 mg | ORAL_CAPSULE | Freq: Three times a day (TID) | ORAL | 0 refills | Status: AC

## 2019-12-12 NOTE — Progress Notes (Signed)
Initial Nutrition Assessment  DOCUMENTATION CODES:   Morbid obesity  INTERVENTION:   -MVI with minerals daily -Ensure Max po BID, each supplement provides 150 kcal and 30 grams of protein  NUTRITION DIAGNOSIS:   Increased nutrient needs related to wound healing as evidenced by estimated needs.  GOAL:   Patient will meet greater than or equal to 90% of their needs  MONITOR:   PO intake, Supplement acceptance, Labs, Weight trends, Skin, I & O's  REASON FOR ASSESSMENT:   Consult Assessment of nutrition requirement/status  ASSESSMENT:   Alfred Meyers is a 74 y.o. male with medical history significant of MGUS; HLD; depression; BPH; DM; HTN; and afib on Coumadin presenting with R leg cellulitis.  He came because of his right leg being infected. Symptoms started about 2 days ago.  No known trauma.  No fever.  +HA.  No h/o prior.  Pt admitted with RLE cellulitis.   Reviewed I/O's: +3.4 L x 24 hours  Pt unavailable at time of visit.   No meal completion data available to assess at this time. Also no wt hx to assess at this time.   Pt with increased nutritional needs due to wound healing and would benefit from addition of oral nutrition supplements.   Medications reviewed and include colace.  Lab Results  Component Value Date   HGBA1C 6.9 (H) 12/11/2019   PTA DM medications are none.   Labs reviewed: CBGS: 203-208 (inpatient orders for glycemic control are 0-20 units TID with meals, 0-5 units insulin aspart daily at bedtime, and 95 units insulin aspart protamine- aspart BID with meals).   Diet Order:   Diet Order            Diet Carb Modified           Diet heart healthy/carb modified Room service appropriate? Yes; Fluid consistency: Thin  Diet effective now                 EDUCATION NEEDS:   No education needs have been identified at this time  Skin:  Skin Assessment: Skin Integrity Issues: Skin Integrity Issues:: Other (Comment) Other: RLE cellulitis and  stasis dermatitis  Last BM:  12/10/19  Height:   Ht Readings from Last 1 Encounters:  12/10/19 5\' 9"  (1.753 m)    Weight:   Wt Readings from Last 1 Encounters:  12/10/19 (!) 144.7 kg    Ideal Body Weight:  72.7 kg  BMI:  Body mass index is 47.11 kg/m.  Estimated Nutritional Needs:   Kcal:  2150-2350  Protein:  130-145 grams  Fluid:  > 2 L    Loistine Chance, RD, LDN, [place] Registered Dietitian II Certified Diabetes Care and Education Specialist Please refer to [facility] for RD and/or RD on-call/weekend/after hours pager

## 2019-12-12 NOTE — Discharge Instructions (Signed)

## 2019-12-12 NOTE — Discharge Summary (Signed)
[facility]  Physician Discharge Summary   Patient ID: Alfred Meyers MRN: [id number] DOB/AGE: [birth year] 76 y.o.  Admit date: 12/10/2019 Discharge date: 12/12/2019  PCP: [facility] [place]:  Right lower extremity cellulitis Bilateral lower extremity venous stasis dermatitis Chronic venous insufficiency MGUS History of essential hypertension Diabetes mellitus type 2 History of mood disorder History of hyperlipidemia BPH History of atrial fibrillation Chronic kidney disease stage IIIb Morbid obesity  RECOMMENDATIONS FOR OUTPATIENT FOLLOW UP: 1. Home health for wound care 2. Patient to follow-up with PCP at the [place]: RN for wound care Equipment/Devices: None  CODE STATUS: Full code  DISCHARGE CONDITION: fair  Diet recommendation: Modified carbohydrate  INITIAL HISTORY: 74 y.o. male with medical history significant of MGUS; HLD; depression; BPH; DM; HTN; and afib on Coumadin presented with R leg cellulitis.  He came because of his right leg being infected. Symptoms started about 2 days ago.  No known trauma.  No fever.  +HA.  No h/o prior.   Consultations:  None  Procedures:  None   HOSPITAL COURSE:    RLE Cellulitis -Patient without prior h/o cellulitis but obviously chronic B venous stasis with dermatitis presenting with worsening erythema to R lower leg with skin breakdown and weeping of purulent drainage -Normal WBC count -Patient was started on ceftriaxone. WBC remains normal.  He is afebrile.  He states that he is feeling better.  He says he wants to go home.  His wife is at the bedside. Since patient is otherwise doing well it is felt reasonable to discharge him at this time.  He was told about potential risk of worsening of his cellulitis. He was transitioned to Keflex which he tolerated well.  B LE stasis dermatitis -Patient's wife has been wrapping both of his legs nightly with gauze and then  ACE wraps Seen by wound care.  Home health ordered. ABIs were unremarkable both lower extremities.  Lactic acidosis -The patient is noted to have a lactate>4 (initial was lower).   However patient was noted to be quite stable.  Lactic acid trended and showed improvement.  MGUS -Appears to have had abnormal bone marrow biopsy in 2019 -There are no obvious clinical notes from Dr. 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  Essential hypertension -Continue Cozaar, Toprol XL  Diabetes mellitus type 2 HbA1c 6.9.  Resume home medications.  Mood d/o -Continue Celexa and prn Vistaril  HLD -Continue Lipitor  BPH -Continue Flomax, Proscar  History of chronic atrial fibrillation -Rate controlled with Amiodarone, Toprol XL, and pacemaker -Continue Coumadin.  Follow-up at the [facility] for PT/INR check  Stage 3b CKD -s/p L nephrectomy Renal function stable.  Creatinine better today compared to yesterday.  See labs below.  Morbid obesity Body mass index is 47.11 kg/m. -Weight loss should be encouraged -Outpatient PCP/bariatric medicine/bariatric surgery f/u encouraged   Patient very keen on going home today.  Apparently his wife does not have a way back and she has to take her medications which she has not taken in the last 2 days because she has been here with him.  Offered alternative transport to the wife which they declined.  They would rather go home.  Since he is otherwise stable he is okay for discharge.    PERTINENT LABS:  The results of significant diagnostics from  this hospitalization (including imaging, microbiology, ancillary and laboratory) are listed below for reference.    Microbiology: Recent Results (from the past 240 hour(s))  Blood culture (routine x 2)     Status: None (Preliminary result)   Collection Time: 12/11/19   5:00 AM   Specimen: BLOOD  Result Value Ref Range Status   Specimen Description BLOOD LEFT ANTECUBITAL  Final   Special Requests   Final    BOTTLES DRAWN AEROBIC AND ANAEROBIC Blood Culture adequate volume   Culture   Final    NO GROWTH < 24 HOURS Performed at [facility], [address], [place], [place] [address]    Report Status PENDING  Incomplete  Blood culture (routine x 2)     Status: None (Preliminary result)   Collection Time: 12/11/19  5:05 AM   Specimen: BLOOD  Result Value Ref Range Status   Specimen Description BLOOD RIGHT ANTECUBITAL  Final   Special Requests   Final    BOTTLES DRAWN AEROBIC AND ANAEROBIC Blood Culture adequate volume   Culture   Final    NO GROWTH < 24 HOURS Performed at [facility], [place] [address], [place], [place] [address]    Report Status PENDING  Incomplete  Respiratory Panel by RT PCR (Flu A&B, Covid) - Nasopharyngeal Swab     Status: None   Collection Time: 12/11/19  7:32 AM   Specimen: Nasopharyngeal Swab  Result Value Ref Range Status   SARS Coronavirus 2 by RT PCR NEGATIVE NEGATIVE Final    Comment: (NOTE) SARS-CoV-2 target nucleic acids are NOT DETECTED.  The SARS-CoV-2 RNA is generally detectable in upper respiratoy specimens during the acute phase of infection. The lowest concentration of SARS-CoV-2 viral copies this assay can detect is 131 copies/mL. A negative result does not preclude SARS-Cov-2 infection and should not be used as the sole basis for treatment or other patient management decisions. A negative result may occur with  improper specimen collection/handling, submission of specimen other than nasopharyngeal swab, presence of viral mutation(s) within the areas targeted by this assay, and inadequate number of viral copies (<131 copies/mL). A negative result must be combined with clinical observations, patient history, and epidemiological information. The expected result is Negative.  Fact Sheet for  Patients:  PinkCheek.be  Fact Sheet for Healthcare Providers:  GravelBags.it  This test is no t yet approved or cleared by the [place] FDA and  has been authorized for detection and/or diagnosis of SARS-CoV-2 by FDA under an Emergency Use Authorization (EUA). This EUA will remain  in effect (meaning this test can be used) for the duration of the COVID-19 declaration under Section 564(b)(1) of the Act, 21 U.S.C. section 360bbb-3(b)(1), unless the authorization is terminated or revoked sooner.     Influenza A by PCR NEGATIVE NEGATIVE Final   Influenza B by PCR NEGATIVE NEGATIVE Final    Comment: (NOTE) The Xpert Xpress SARS-CoV-2/FLU/RSV assay is intended as an aid in  the diagnosis of influenza from Nasopharyngeal swab specimens and  should not be used as a sole basis for treatment. Nasal washings and  aspirates are unacceptable for Xpert Xpress SARS-CoV-2/FLU/RSV  testing.  Fact Sheet for Patients: PinkCheek.be  Fact Sheet for Healthcare Providers: GravelBags.it  This test is not yet approved or cleared by the [place] FDA and  has been authorized for detection and/or diagnosis of SARS-CoV-2 by  FDA under an Emergency Use Authorization (EUA). This EUA will remain  in effect (meaning this test  can be used) for the duration of the  Covid-19 declaration under Section 564(b)(1) of the Act, 21  U.S.C. section 360bbb-3(b)(1), unless the authorization is  terminated or revoked. Performed at [facility], [place] [address], [place], [place] [address]      Labs:    Basic Metabolic Panel: Recent Labs  Lab 12/10/19 1626 12/12/19 0126  NA 135 136  K 4.8 4.3  CL 101 101  CO2 23 25  GLUCOSE 204* 210*  BUN 22 15  CREATININE 1.83* 1.50*  CALCIUM 8.7* 8.6*   Liver Function Tests: Recent Labs  Lab 12/10/19 1626  AST 26  ALT 20  ALKPHOS 127*   BILITOT 0.8  PROT 6.8  ALBUMIN 3.1*   CBC: Recent Labs  Lab 12/10/19 1626 12/12/19 0126  WBC 8.7 9.5  NEUTROABS 6.9  --   HGB 11.5* 12.0*  HCT 37.1* 37.3*  MCV 93.7 93.5  PLT 187 205    CBG: Recent Labs  Lab 12/10/19 1847 12/11/19 1218 12/11/19 2105 12/12/19 0826 12/12/19 1128  GLUCAP 176* 203* 208* 215* 195*     IMAGING STUDIES DG Tibia/Fibula Right  Result Date: 12/11/2019 CLINICAL DATA:  Cellulitis. EXAM: RIGHT TIBIA AND FIBULA - 2 VIEW COMPARISON:  None. FINDINGS: Diffuse edematous changes are present throughout the soft tissues lower extremity. Skin thickening is evident. Degenerative changes are noted at the knee and ankle without acute or focal osseous abnormalities otherwise. IMPRESSION: 1. Diffuse edematous changes throughout the soft tissues. 2. No acute or focal osseous abnormality. 3. Degenerative changes at the knee and ankle. Electronically Signed   By: San Morelle M.D.   On: 12/11/2019 05:31   VAS [place] ABI WITH/WO TBI  Result Date: 12/11/2019 LOWER EXTREMITY DOPPLER STUDY Indications: Claudication, and peripheral artery disease. High Risk Factors: Hypertension, hyperlipidemia, Diabetes.  Performing Technologist: Griffin Basil RCT RDMS  Examination Guidelines: A complete evaluation includes at minimum, Doppler waveform signals and systolic blood pressure reading at the level of bilateral brachial, anterior tibial, and posterior tibial arteries, when vessel segments are accessible. Bilateral testing is considered an integral part of a complete examination. Photoelectric Plethysmograph (PPG) waveforms and toe systolic pressure readings are included as required and additional duplex testing as needed. Limited examinations for reoccurring indications may be performed as noted.  ABI Findings: +---------+------------------+-----+---------+--------+ Right    Rt Pressure (mmHg)IndexWaveform Comment  +---------+------------------+-----+---------+--------+  Brachial 163                    triphasic         +---------+------------------+-----+---------+--------+ PTA      150               0.92                   +---------+------------------+-----+---------+--------+ PERO                            triphasic         +---------+------------------+-----+---------+--------+ DP       154               0.94 triphasic         +---------+------------------+-----+---------+--------+ Great Toe142               0.87 Normal            +---------+------------------+-----+---------+--------+ +---------+------------------+-----+---------+-------+ Left     Lt Pressure (mmHg)IndexWaveform Comment +---------+------------------+-----+---------+-------+ Brachial 156  triphasic        +---------+------------------+-----+---------+-------+ PTA      168               1.03                  +---------+------------------+-----+---------+-------+ PERO                            triphasic        +---------+------------------+-----+---------+-------+ DP       154               0.94 triphasic        +---------+------------------+-----+---------+-------+ Great Toe147               0.90 Normal           +---------+------------------+-----+---------+-------+ TOES Findings: +----------+---------------+--------+-------+ Right ToesPressure (mmHg)WaveformComment +----------+---------------+--------+-------+ 1st Digit 142            Normal          +----------+---------------+--------+-------+  +---------+---------------+--------+-------+ Left ToesPressure (mmHg)WaveformComment +---------+---------------+--------+-------+ 1st [id number]            Normal          +---------+---------------+--------+-------+    Summary: Right: Resting right ankle-brachial index is within normal range. No evidence of significant right lower extremity arterial disease. The right toe-brachial index is normal. Left: Resting left  ankle-brachial index is within normal range. No evidence of significant left lower extremity arterial disease. The left toe-brachial index is normal.  *See table(s) above for measurements and observations.  Electronically signed by Servando Snare MD on 12/11/2019 at 5:37:23 PM.    Final     DISCHARGE EXAMINATION: Vitals:   12/11/19 2027 12/11/19 2334 12/12/19 0406 12/12/19 1047  BP: (!) 158/85 134/67 120/65 123/68  Pulse: 77 76 70 70  Resp: 18 18 18 18   Temp: 98.1 F (36.7 C) 97.8 F (36.6 C) 97.6 F (36.4 C) 98.4 F (36.9 C)  TempSrc: Oral Oral Oral Oral  SpO2: 97% 94% 94% 95%  Weight:      Height:       General appearance: Awake alert.  In no distress Resp: Clear to auscultation bilaterally.  Normal effort Cardio: S1-S2 is normal regular.  No S3-S4.  No rubs murmurs or bruit GI: Abdomen is soft.  Nontender nondistended.  Bowel sounds are present normal.  No masses organomegaly Extremities: Right lower extremity covered in dressing.  Chronic venous stasis dermatitis noted of the left leg. Neurologic: Alert and oriented x3.  No focal neurological deficits.    DISPOSITION: Home with wife  Discharge Instructions    Call MD for:  difficulty breathing, headache or visual disturbances   Complete by: As directed    Call MD for:  extreme fatigue   Complete by: As directed    Call MD for:  hives   Complete by: As directed    Call MD for:  persistant dizziness or light-headedness   Complete by: As directed    Call MD for:  persistant nausea and vomiting   Complete by: As directed    Call MD for:  redness, tenderness, or signs of infection (pain, swelling, redness, odor or green/yellow discharge around incision site)   Complete by: As directed    Call MD for:  severe uncontrolled pain   Complete by: As directed    Call MD for:  temperature >100.4   Complete by: As directed    Diet Carb Modified  Complete by: As directed    Discharge instructions   Complete by: As directed     Please be sure to follow-up with your primary care provider within 1-2 weeks.  Seek attention immediately if your symptoms worsen or if you develop high fevers.  You were cared for by a hospitalist during your hospital stay. If you have any questions about your discharge medications or the care you received while you were in the hospital after you are discharged, you can call the unit and asked to speak with the hospitalist on call if the hospitalist that took care of you is not available. Once you are discharged, your primary care physician will handle any further medical issues. Please note that NO REFILLS for any discharge medications will be authorized once you are discharged, as it is imperative that you return to your primary care physician (or establish a relationship with a primary care physician if you do not have one) for your aftercare needs so that they can reassess your need for medications and monitor your lab values. If you do not have a primary care physician, you can call [phone] for a physician referral.   Discharge wound care:   Complete by: As directed    Clean RLE with soap and water, rinse and pat dry. Apply Xeroform gauze Kellie Simmering # 294) to the open areas, wrap with kerlix then cover from toe to just below the knee with Ace Wrap Kellie Simmering # [phone])   Increase activity slowly   Complete by: As directed        Allergies as of 12/12/2019   No Known Allergies     Medication List    TAKE these medications   allopurinol 100 MG tablet Commonly known as: ZYLOPRIM Take 100 mg by mouth 2 (two) times daily.   amiodarone 200 MG tablet Commonly known as: PACERONE Take 200 mg by mouth at bedtime.   ARTIFICIAL SALIVA MT Use as directed 4 sprays in the mouth or throat every hour as needed (dry mouth).   atorvastatin 80 MG tablet Commonly known as: LIPITOR Take 80 mg by mouth at bedtime.   cadexomer iodine 0.9 % gel Commonly known as: IODOSORB Apply 1 application topically See  admin instructions. Apply a small amount to affected area daily to left foot wound after cleansing foot with carra klens wound cleanser   cephALEXin 500 MG capsule Commonly known as: KEFLEX Take 1 capsule (500 mg total) by mouth every 8 (eight) hours for 7 days.   cetirizine 10 MG tablet Commonly known as: ZYRTEC Take 10 mg by mouth at bedtime.   citalopram 20 MG tablet Commonly known as: CELEXA Take 10 mg by mouth daily.   finasteride 5 MG tablet Commonly known as: PROSCAR Take 5 mg by mouth daily.   gabapentin 400 MG capsule Commonly known as: NEURONTIN Take 400 mg by mouth at bedtime.   hydrOXYzine 100 MG capsule Commonly known as: VISTARIL Take 100 mg by mouth as needed for itching or anxiety.   insulin aspart protamine - aspart (70-30) 100 UNIT/ML FlexPen Commonly known as: NOVOLOG 70/30 MIX Inject 95 Units into the skin in the morning and at bedtime.   lidocaine 5 % Commonly known as: LIDODERM Place 1 patch onto the skin daily. Remove & Discard patch within 12 hours or as directed by MD   liraglutide 18 MG/3ML Sopn Commonly known as: VICTOZA Inject 1 mg into the skin once a week. Verified ($RemoveBeforeD'1mg'[id number]$ ) weekly.   losartan 100 MG tablet  Commonly known as: COZAAR Take 25 mg by mouth at bedtime.   metoprolol 200 MG 24 hr tablet Commonly known as: TOPROL-XL Take 100 mg by mouth daily.   naloxone 4 MG/0.1ML Liqd nasal spray kit Commonly known as: NARCAN Place 1 spray into the nose once. For opioid overdose.   oxybutynin 5 MG tablet Commonly known as: DITROPAN Take 5 mg by mouth daily.   potassium chloride SA 20 MEQ tablet Commonly known as: KLOR-CON Take 40 mEq by mouth daily.   Propylene Glycol 0.6 % Soln Place 1 drop into both eyes in the morning, at noon, and at bedtime.   terazosin 10 MG capsule Commonly known as: HYTRIN Take 10 mg by mouth at bedtime.   torsemide 20 MG tablet Commonly known as: DEMADEX Take 20 mg by mouth daily.   traMADol 50 MG  tablet Commonly known as: ULTRAM Take 50 mg by mouth daily.   triamcinolone cream 0.1 % Commonly known as: KENALOG Apply 1 application topically 2 (two) times daily.   warfarin 5 MG tablet Commonly known as: COUMADIN Take 5-7.5 mg by mouth See admin instructions. 67m five (5) times per week on Tues, Wed, Thurs, Sat, Sun. 7.547mtwo (2) times per week on Mon and Friday. [facility]  ([facility], onward)         Start     Ordered   12/12/19 0000  Discharge wound care:       Comments: Clean RLE with soap and water, rinse and pat dry. Apply Xeroform gauze (LKellie Simmering 294) to the open areas, wrap with kerlix then cover from toe to just below the knee with Ace WrKevan NyLKellie Simmering [id number]  12/12/19 1257            Follow-up Information    Schedule an appointment as soon as possible for a visit  with [facility]   Contact information: [phone]             TOTAL DISCHARGE TIME: 3525inutes  [facility] Pager on [email or web address]  12/12/2019, 2:07 PM

## 2019-12-12 NOTE — Progress Notes (Signed)
[place] for warfarin Indication: atrial fibrillation   Assessment: 24 yom with history of afib on warfarin PTA presenting with R leg cellulitis and started on ceftriaxone. Pharmacy consulted to dose warfarin inpatient. INR therapeutic at 2.5 on admit. Last dose PTA was 10/11 PM, missed a dose on 10/12.   INR 2.5 stable. Noted, patient continuing on PTA amiodarone inpatient. Patient states eating much less here than at home.   PTA warfarin dose: 5mg  daily except 7.5mg  on Mon/Fri (Confirmed with patient, follows up at the [place] in [place])  Goal of Therapy:  INR 2-3 Monitor platelets by anticoagulation protocol: Yes   Plan:  Warfarin 5mg  x1  Monitor daily INR, CBC/plt Monitor for signs/symptoms of bleeding   Benetta Spar, PharmD, BCPS, BCCP Clinical Pharmacist  Please check AMION for all [place] phone numbers After 10:00 PM, call [place]

## 2019-12-12 NOTE — Plan of Care (Signed)
  Problem: Education: Goal: Knowledge of General Education information will improve Description Including pain rating scale, medication(s)/side effects and non-pharmacologic comfort measures Outcome: Progressing   

## 2019-12-12 NOTE — Progress Notes (Signed)
Reviewed discharge instructions with patient and spouse including wound care. Supplies given to patient enough for the weekend. RX of keflex also given. Pt and spouse both verbalize understanding of discharge instructions and [facility] with [facility] and [facility]. IV d/c with catheter in tact. 2x2 gauze applied. No bleeding noted at time of discharge. Pt discharged via wheelchair escorted by Nurse Judithann Sheen.

## 2019-12-12 NOTE — TOC Initial Note (Signed)
Transition of Care [facility]) - Initial/Assessment Note    Patient Details  Name: Alfred Meyers MRN: [id number] Date of Birth: Feb 21, 1946  Transition of Care [facility]) CM/SW Contact:    Marilu Favre, RN Phone Number: 12/12/2019, 3:01 PM  Clinical Narrative:                  Spoke to patient and wife Alfred Meyers at bedside. Confirmed face sheet information.   Patient's PCP DR Felton Clinton at [facility].Patient wants home health to go through [place].   Explained nurse at hospital will provide wound care education to patient and wife prior to discharge. HHRN will not be able to make daily visits. NCM will call and fax clinicals and home health orders to DR Felton Clinton at [place].   Patient's [place] [phone]. Paged awaiting call.   [facility] spoke with Mariann Laster Dr Felton Clinton fax number is [phone] . Clinicals and orders faxed . Patient has no preference for [facility] agency , referral given to Bea Graff cannot start care until receive [facility].  Patient and wife aware and voice understanding. Expected Discharge Plan: [place] Barriers to Discharge: No Barriers Identified   Patient Goals and CMS Choice Patient states their goals for this hospitalization and ongoing recovery are:: to return to home CMS Medicare.gov Compare Post Acute Care list provided to:: Patient Choice offered to / list presented to : Patient, Spouse  Expected Discharge Plan and Services Expected Discharge Plan: [place]   Discharge Planning Services: CM Consult Post Acute Care Choice: [place] arrangements for the past 2 months: Single Family Home Expected Discharge Date: 12/12/19               DME Arranged: N/A         HH Arranged: RN [place] Agency: [place] Date [facility] Agency Contacted: 12/12/19 Time HH Agency Contacted: 1500 Representative spoke with at [place]: Tommi Rumps need approval from the [place]  Prior Living Arrangements/Services Living  arrangements for the past 2 months: [place] Lives with:: Spouse Patient language and need for interpreter reviewed:: Yes Do you feel safe going back to the place where you live?: Yes      Need for Family Participation in Patient Care: Yes (Comment) Care giver support system in place?: Yes (comment)   Criminal Activity/Legal Involvement Pertinent to Current Situation/Hospitalization: No - Comment as needed  Activities of Daily Living Home Assistive Devices/Equipment: CBG Meter, Cane (specify quad or straight) ADL Screening (condition at time of admission) Patient's cognitive ability adequate to safely complete daily activities?: Yes Is the patient deaf or have difficulty hearing?: Yes Does the patient have difficulty seeing, even when wearing glasses/contacts?: No Does the patient have difficulty concentrating, remembering, or making decisions?: No Patient able to express need for assistance with ADLs?: Yes Does the patient have difficulty dressing or bathing?: No Independently performs ADLs?: Yes (appropriate for developmental age) Does the patient have difficulty walking or climbing stairs?: Yes Weakness of Legs: None Weakness of Arms/Hands: None  Permission Sought/Granted   Permission granted to share information with : Yes, Verbal Permission Granted  Share Information with NAME: wife           Emotional Assessment Appearance:: Appears stated age Attitude/Demeanor/Rapport: Engaged Affect (typically observed): Accepting Orientation: : Oriented to Self, Oriented to Place, Oriented to  Time, Oriented to Situation Alcohol / Substance Use: Not Applicable Psych Involvement: No (comment)  Admission diagnosis:  Cellulitis of right leg [L03.115] Elevated lactic acid level [R79.89] Cellulitis and abscess of right leg [L03.115, L02.415] Patient Active Problem List   Diagnosis Date Noted  . Cellulitis and abscess of right leg 12/11/2019  . Lactic acidosis 12/11/2019  .  Stasis dermatitis of both legs 12/11/2019  . Stage 3b chronic kidney disease ([place]) 12/11/2019  . Morbid obesity with BMI of 45.0-49.9, adult ([place])   . MGUS (monoclonal gammopathy of unknown significance)   . Hypertension   . Dyslipidemia   . Diabetes mellitus type 2 in obese ([place])   . Atrial fibrillation [facility])    PCP:  Clinic, [place]:   [place], [place] - [address] [place] [phone] [place] [address] Phone: [phone] Fax: [phone]     Social Determinants of Health (SDOH) Interventions    Readmission Risk Interventions No flowsheet data found.

## 2019-12-16 LAB — CULTURE, BLOOD (ROUTINE X 2)
Culture: NO GROWTH
Culture: NO GROWTH
Special Requests: ADEQUATE
Special Requests: ADEQUATE

## 2019-12-19 ENCOUNTER — Other Ambulatory Visit (HOSPITAL_COMMUNITY): Payer: Self-pay | Admitting: Student

## 2019-12-19 DIAGNOSIS — D472 Monoclonal gammopathy: Secondary | ICD-10-CM

## 2019-12-22 ENCOUNTER — Other Ambulatory Visit (HOSPITAL_COMMUNITY): Payer: Self-pay | Admitting: Student

## 2019-12-22 DIAGNOSIS — D472 Monoclonal gammopathy: Secondary | ICD-10-CM

## 2019-12-26 ENCOUNTER — Other Ambulatory Visit (HOSPITAL_COMMUNITY): Payer: Self-pay | Admitting: Student

## 2019-12-29 ENCOUNTER — Other Ambulatory Visit (HOSPITAL_COMMUNITY): Payer: Self-pay | Admitting: Internal Medicine

## 2019-12-29 DIAGNOSIS — D472 Monoclonal gammopathy: Secondary | ICD-10-CM

## 2020-01-12 ENCOUNTER — Other Ambulatory Visit: Payer: Self-pay | Admitting: Student

## 2020-01-13 ENCOUNTER — Ambulatory Visit (HOSPITAL_COMMUNITY)
Admission: RE | Admit: 2020-01-13 | Discharge: 2020-01-13 | Disposition: A | Discharge status: Home / Self Care | Payer: No Typology Code available for payment source | Source: Ambulatory Visit | Attending: Internal Medicine | Admitting: Internal Medicine

## 2020-01-13 ENCOUNTER — Ambulatory Visit (HOSPITAL_COMMUNITY): Payer: No Typology Code available for payment source

## 2020-01-13 ENCOUNTER — Encounter (HOSPITAL_COMMUNITY): Payer: Self-pay

## 2020-02-02 ENCOUNTER — Other Ambulatory Visit: Payer: Self-pay | Admitting: Radiology

## 2020-02-03 ENCOUNTER — Encounter (HOSPITAL_COMMUNITY): Payer: Self-pay

## 2020-02-03 ENCOUNTER — Ambulatory Visit (HOSPITAL_COMMUNITY)
Admission: RE | Admit: 2020-02-03 | Discharge: 2020-02-03 | Disposition: A | Discharge status: Home / Self Care | Payer: No Typology Code available for payment source | Source: Ambulatory Visit | Attending: Interventional Radiology | Admitting: Interventional Radiology

## 2020-02-03 ENCOUNTER — Other Ambulatory Visit: Payer: Self-pay

## 2020-02-03 ENCOUNTER — Ambulatory Visit (HOSPITAL_COMMUNITY)
Admission: RE | Admit: 2020-02-03 | Discharge: 2020-02-03 | Disposition: A | Discharge status: Home / Self Care | Payer: No Typology Code available for payment source | Source: Ambulatory Visit

## 2020-02-03 DIAGNOSIS — D649 Anemia, unspecified: Secondary | ICD-10-CM | POA: Diagnosis not present

## 2020-02-03 DIAGNOSIS — D472 Monoclonal gammopathy: Secondary | ICD-10-CM | POA: Diagnosis not present

## 2020-02-03 DIAGNOSIS — C9000 Multiple myeloma not having achieved remission: Secondary | ICD-10-CM | POA: Diagnosis present

## 2020-02-03 LAB — PROTIME-INR
INR: 1.6 — ABNORMAL HIGH (ref 0.8–1.2)
Prothrombin Time: 18.8 seconds — ABNORMAL HIGH (ref 11.4–15.2)

## 2020-02-03 LAB — CBC WITH DIFFERENTIAL/PLATELET
Abs Immature Granulocytes: 0.04 10*3/uL (ref 0.00–0.07)
Basophils Absolute: 0.1 10*3/uL (ref 0.0–0.1)
Basophils Relative: 1 %
Eosinophils Absolute: 0.3 10*3/uL (ref 0.0–0.5)
Eosinophils Relative: 4 %
HCT: 38.1 % — ABNORMAL LOW (ref 39.0–52.0)
Hemoglobin: 12.1 g/dL — ABNORMAL LOW (ref 13.0–17.0)
Immature Granulocytes: 0 %
Lymphocytes Relative: 16 %
Lymphs Abs: 1.5 10*3/uL (ref 0.7–4.0)
MCH: 29 pg (ref 26.0–34.0)
MCHC: 31.8 g/dL (ref 30.0–36.0)
MCV: 91.4 fL (ref 80.0–100.0)
Monocytes Absolute: 0.6 10*3/uL (ref 0.1–1.0)
Monocytes Relative: 7 %
Neutro Abs: 6.7 10*3/uL (ref 1.7–7.7)
Neutrophils Relative %: 72 %
Platelets: 179 10*3/uL (ref 150–400)
RBC: 4.17 MIL/uL — ABNORMAL LOW (ref 4.22–5.81)
RDW: 16.9 % — ABNORMAL HIGH (ref 11.5–15.5)
WBC: 9.2 10*3/uL (ref 4.0–10.5)
nRBC: 0 % (ref 0.0–0.2)

## 2020-02-03 LAB — GLUCOSE, CAPILLARY: Glucose-Capillary: 129 mg/dL — ABNORMAL HIGH (ref 70–99)

## 2020-02-03 MED ORDER — FENTANYL CITRATE (PF) 100 MCG/2ML IJ SOLN
INTRAMUSCULAR | Status: AC
  Filled 2020-02-03: qty 2

## 2020-02-03 MED ORDER — NALOXONE HCL 0.4 MG/ML IJ SOLN
INTRAMUSCULAR | Status: AC
  Filled 2020-02-03: qty 1

## 2020-02-03 MED ORDER — LIDOCAINE HCL (PF) 1 % IJ SOLN
INTRAMUSCULAR | Status: AC | PRN
  Administered 2020-02-03: 10 mL via INTRADERMAL

## 2020-02-03 MED ORDER — MIDAZOLAM HCL 2 MG/2ML IJ SOLN
INTRAMUSCULAR | Status: AC
  Filled 2020-02-03: qty 2

## 2020-02-03 MED ORDER — NALOXONE HCL 0.4 MG/ML IJ SOLN
INTRAMUSCULAR | Status: AC | PRN
  Administered 2020-02-03: 0.2 mg via INTRAVENOUS

## 2020-02-03 MED ORDER — FENTANYL CITRATE (PF) 100 MCG/2ML IJ SOLN
INTRAMUSCULAR | Status: AC | PRN
Start: 2020-02-03 — End: 2020-02-03
  Administered 2020-02-03 (×2): 50 ug via INTRAVENOUS

## 2020-02-03 MED ORDER — FLUMAZENIL 0.5 MG/5ML IV SOLN
INTRAVENOUS | Status: AC
  Filled 2020-02-03: qty 5

## 2020-02-03 MED ORDER — MIDAZOLAM HCL 2 MG/2ML IJ SOLN
INTRAMUSCULAR | Status: AC | PRN
  Administered 2020-02-03 (×3): 1 mg via INTRAVENOUS

## 2020-02-03 MED ORDER — SODIUM CHLORIDE 0.9 % IV SOLN: INTRAVENOUS | Status: DC

## 2020-02-03 NOTE — Sedation Documentation (Signed)
Patient's O2 92% but not responding to sternal rub. Dr Annamaria Boots notified by Jerilynn Mages. Smith, RT.

## 2020-02-03 NOTE — Progress Notes (Signed)
Pt stated he took his Coumadin yesterday (5mg ).  Informed PA and order received for stat INR/PT.  1020: results PT 18.8 / INR 1.6 informed PA Rushie Nyhan at 1030.

## 2020-02-03 NOTE — Sedation Documentation (Addendum)
Dr Annamaria Boots at bedside. No additional orders at this time. Patient responding to sternal rub. Opening eyes. Continuing to monitor closely.

## 2020-02-03 NOTE — Discharge Instructions (Signed)
Bone Marrow Aspiration and Bone Marrow Biopsy, Adult, Care After This sheet gives you information about how to care for yourself after your procedure. Your health care provider may also give you more specific instructions. If you have problems or questions, contact your health care provider. What can I expect after the procedure? After the procedure, it is common to have:  Mild pain and tenderness.  Swelling.  Bruising. Follow these instructions at home: Puncture site care   Follow instructions from your health care provider about how to take care of the puncture site. Make sure you: ? Wash your hands with soap and water before and after you change your bandage (dressing). If soap and water are not available, use hand sanitizer. ? Change your dressing as told by your health care provider.  Check your puncture site every day for signs of infection. Check for: ? More redness, swelling, or pain. ? Fluid or blood. ? Warmth. ? Pus or a bad smell. Activity  Return to your normal activities as told by your health care provider. Ask your health care provider what activities are safe for you.  Do not lift anything that is heavier than 10 lb (4.5 kg), or the limit that you are told, until your health care provider says that it is safe.  Do not drive for 24 hours if you were given a sedative during your procedure. General instructions   Take over-the-counter and prescription medicines only as told by your health care provider.  Do not take baths, swim, or use a hot tub until your health care provider approves. Ask your health care provider if you may take showers. You may only be allowed to take sponge baths.  If directed, put ice on the affected area. To do this: ? Put ice in a plastic bag. ? Place a towel between your skin and the bag. ? Leave the ice on for 20 minutes, 2-3 times a day.  Keep all follow-up visits as told by your health care provider. This is important. Contact a  health care provider if:  Your pain is not controlled with medicine.  You have a fever.  You have more redness, swelling, or pain around the puncture site.  You have fluid or blood coming from the puncture site.  Your puncture site feels warm to the touch.  You have pus or a bad smell coming from the puncture site. Summary  After the procedure, it is common to have mild pain, tenderness, swelling, and bruising.  Follow instructions from your health care provider about how to take care of the puncture site and what activities are safe for you.  Take over-the-counter and prescription medicines only as told by your health care provider.  Contact a health care provider if you have any signs of infection, such as fluid or blood coming from the puncture site. This information is not intended to replace advice given to you by your health care provider. Make sure you discuss any questions you have with your health care provider. Document Revised: 07/02/2018 Document Reviewed: 07/02/2018 Elsevier Patient Education  2020 Elsevier Inc. Moderate Conscious Sedation, Adult, Care After These instructions provide you with information about caring for yourself after your procedure. Your health care provider may also give you more specific instructions. Your treatment has been planned according to current medical practices, but problems sometimes occur. Call your health care provider if you have any problems or questions after your procedure. What can I expect after the procedure? After your procedure,   it is common:  To feel sleepy for several hours.  To feel clumsy and have poor balance for several hours.  To have poor judgment for several hours.  To vomit if you eat too soon. Follow these instructions at home: For at least 24 hours after the procedure:   Do not: ? Participate in activities where you could fall or become injured. ? Drive. ? Use heavy machinery. ? Drink alcohol. ? Take  sleeping pills or medicines that cause drowsiness. ? Make important decisions or sign legal documents. ? Take care of children on your own.  Rest. Eating and drinking  Follow the diet recommended by your health care provider.  If you vomit: ? Drink water, juice, or soup when you can drink without vomiting. ? Make sure you have little or no nausea before eating solid foods. General instructions  Have a responsible adult stay with you until you are awake and alert.  Take over-the-counter and prescription medicines only as told by your health care provider.  If you smoke, do not smoke without supervision.  Keep all follow-up visits as told by your health care provider. This is important. Contact a health care provider if:  You keep feeling nauseous or you keep vomiting.  You feel light-headed.  You develop a rash.  You have a fever. Get help right away if:  You have trouble breathing. This information is not intended to replace advice given to you by your health care provider. Make sure you discuss any questions you have with your health care provider. Document Revised: 01/26/2017 Document Reviewed: 06/05/2015 Elsevier Patient Education  2020 Elsevier Inc.  

## 2020-02-03 NOTE — Sedation Documentation (Signed)
Patient responding to voice. Opening eyes and nodding when asked if he is feeling ok.

## 2020-02-03 NOTE — H&P (Signed)
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 Request is for bone marrow biopsy  Referring Physician(s): Agnew,Elizabeth  Supervising Physician: Daryll Brod  Patient Status: [facility] - Out-pt  History of Present Illness: Alfred Meyers is a 74 y.o. male 74 y.o. male [place] outpatient. History of HTN, DM,  a fib, HLD MGUS found to have increase in BMI greater than normal. . Team is requesting a bone marrow biopsy for further evaluation of progression to myeloma.   Past Medical History:  Diagnosis Date  . Atrial fibrillation ([place])   . Cellulitis and abscess of right leg 10-/15/2021  . Diabetes mellitus type 2 in obese ([place])   . Dyslipidemia   . Hypertension   . MGUS (monoclonal gammopathy of unknown significance)   . Morbid obesity with BMI of 45.0-49.9, adult ([place])   . 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 APPENDECTOMY    . NASAL FRACTURE SURGERY    . NEPHRECTOMY  2000   RCC    Allergies: Patient has no known allergies.  Medications: Prior to Admission medications   Medication Sig Start Date End Date Taking? Authorizing Provider  allopurinol (ZYLOPRIM) 100 MG tablet Take 100 mg by mouth 2 (two) times daily.    Yes [provider]  amiodarone (PACERONE) 200 MG tablet Take 200 mg by mouth at bedtime.   Yes [provider]  atorvastatin (LIPITOR) 80 MG tablet Take 80 mg by mouth at bedtime.   Yes [provider]  cetirizine (ZYRTEC) 10 MG tablet Take 10 mg by mouth at bedtime.   Yes [provider]  citalopram (CELEXA) 20 MG tablet Take 10 mg by mouth daily.    Yes [provider]  finasteride (PROSCAR) 5 MG tablet Take 5 mg by mouth daily.   Yes [provider]  gabapentin (NEURONTIN) 400 MG capsule Take 400 mg by mouth at bedtime.   Yes [provider]  hydrOXYzine (VISTARIL) 100 MG capsule Take 100 mg by mouth as needed for  itching or anxiety.   Yes [provider]  insulin aspart protamine - aspart (NOVOLOG 70/30 MIX) (70-30) 100 UNIT/ML FlexPen Inject 95 Units into the skin in the morning and at bedtime.   Yes [provider]  liraglutide (VICTOZA) 18 MG/3ML SOPN Inject 1 mg into the skin once a week. Verified (80m) weekly.   Yes [provider]  losartan (COZAAR) 100 MG tablet Take 25 mg by mouth at bedtime.    Yes [provider]  metoprolol (TOPROL-XL) 200 MG 24 hr tablet Take 100 mg by mouth daily.    Yes [provider]  oxybutynin (DITROPAN) 5 MG tablet Take 5 mg by mouth daily.   Yes [provider]  potassium chloride SA (KLOR-CON) 20 MEQ tablet Take 40 mEq by mouth daily.   Yes [provider]  terazosin (HYTRIN) 10 MG capsule Take 10 mg by mouth at bedtime.   Yes [provider]  torsemide (DEMADEX) 20 MG tablet Take 20 mg by mouth daily.   Yes [provider]  traMADol (ULTRAM) 50 MG tablet Take 50 mg by mouth daily.   Yes [provider]  triamcinolone cream (KENALOG) 0.1 % Apply 1 application topically 2 (two) times daily.   Yes [provider]  warfarin (COUMADIN) 5 MG tablet Take 5-7.5 mg by mouth See admin instructions. 558mfive (5) times per week on Tues, Wed, Thurs,  Sat, Sun. 7.24m two (2) times per week on Mon and Friday. [facility] [place]  Yes [provider]  ARTIFICIAL SALIVA MT Use as directed 4 sprays in the mouth or throat every hour as needed (dry mouth).    [provider]  cadexomer iodine (IODOSORB) 0.9 % gel Apply 1 application topically See admin instructions. Apply a small amount to affected area daily to left foot wound after cleansing foot with carra klens wound cleanser    [provider]  lidocaine (LIDODERM) 5 % Place 1 patch onto the skin daily. Remove & Discard patch within 12 hours or as directed by MD    [provider]   naloxone (NARCAN) nasal spray 4 mg/0.1 mL Place 1 spray into the nose once. For opioid overdose.    [provider]  Propylene Glycol 0.6 % SOLN Place 1 drop into both eyes in the morning, at noon, and at bedtime.    [provider]     History reviewed. No pertinent family history.  Social History   Socioeconomic History  . Marital status: Married    Spouse name: Not on file  . Number of children: Not on file  . Years of education: Not on file  . Highest education level: Not on file  Occupational History  . Not on file  Tobacco Use  . Smoking status: Never Smoker  . Smokeless tobacco: Never Used  Substance and Sexual Activity  . Alcohol use: Never  . Drug use: Never  . Sexual activity: Not on file  Other Topics Concern  . Not on file  Social History Narrative  . Not on file   Social Determinants of Health   Financial Resource Strain:   . Difficulty of Paying Living Expenses: Not on file  Food Insecurity:   . Worried About RCharity fundraiserin the Last Year: Not on file  . Ran Out of Food in the Last Year: Not on file  Transportation Needs:   . Lack of Transportation (Medical): Not on file  . Lack of Transportation (Non-Medical): Not on file  Physical Activity:   . Days of Exercise per Week: Not on file  . Minutes of Exercise per Session: Not on file  Stress:   . Feeling of Stress : Not on file  Social Connections:   . Frequency of Communication with Friends and Family: Not on file  . Frequency of Social Gatherings with Friends and Family: Not on file  . Attends Religious Services: Not on file  . Active Member of Clubs or Organizations: Not on file  . Attends CArchivistMeetings: Not on file  . Marital Status: Not on file     Review of Systems: A 12 point ROS discussed and pertinent positives are indicated in the HPI above.  All other systems are negative.  Review of Systems  Constitutional: Negative for fever.  HENT: Negative  for congestion.   Respiratory: Positive for cough. Negative for shortness of breath.   Cardiovascular: Negative for chest pain.  Gastrointestinal: Negative for abdominal pain.  Neurological: Negative for headaches.  Psychiatric/Behavioral: Negative for behavioral problems and confusion.    Vital Signs: BP (!) 142/85   Pulse 78   Temp 97.6 F (36.4 C) (Oral)   Resp 20   Ht 5' 9" (1.753 m)   Wt (!) 320 lb (145.2 kg)   SpO2 95%   BMI 47.26 kg/m   Physical Exam Vitals and nursing  note reviewed.  Constitutional:      Appearance: He is well-developed.  HENT:     Head: Normocephalic.  Cardiovascular:     Rate and Rhythm: Normal rate. Rhythm irregular.     Heart sounds: Normal heart sounds.  Pulmonary:     Effort: Pulmonary effort is normal.     Breath sounds: Normal breath sounds.  Musculoskeletal:        General: Normal range of motion.     Cervical back: Normal range of motion.  Skin:    General: Skin is dry.  Neurological:     Mental Status: He is alert and oriented to person, place, and time.     Imaging: No results found.  Labs:  CBC: Recent Labs    12/10/19 1626 12/12/19 0126 02/03/20 0950  WBC 8.7 9.5 9.2  HGB 11.5* 12.0* 12.1*  HCT 37.1* 37.3* 38.1*  PLT 187 205 179    COAGS: Recent Labs    12/11/19 0318 12/12/19 0126  INR 2.5* 2.5*    BMP: Recent Labs    12/10/19 1626 12/12/19 0126  NA 135 136  K 4.8 4.3  CL 101 101  CO2 23 25  GLUCOSE 204* 210*  BUN 22 15  CALCIUM 8.7* 8.6*  CREATININE 1.83* 1.50*  GFRNONAA 35* 45*    LIVER FUNCTION TESTS: Recent Labs    12/10/19 1626  BILITOT 0.8  AST 26  ALT 20  ALKPHOS 127*  PROT 6.8  ALBUMIN 3.1*    TUMOR MARKERS: No results for input(s): AFPTM, CEA, CA199, CHROMGRNA in the last 8760 hours.  Assessment and Plan:  74 y.o. male [place] outpatient. History of HTN, DM,  a fib, HLD MGUS found to have elevated BMI . Team is requesting a bone marrow biopsy for further evaluation of  progression to myeloma.   All labs are within acceptable parameters.  Patient is on Coumadin for a fib. NKDA. Patient has been NPO since midnight.   Risks and benefits of bone marrow biopsy was discussed with the patient and/or patient's family including, but not limited to bleeding, infection, damage to adjacent structures or low yield requiring additional tests.  All of the questions were answered and there is agreement to proceed.  Consent signed and in chart.    Thank you for this interesting consult.  I greatly enjoyed meeting Alfred Meyers and look forward to participating in their care.  A copy of this report was sent to the requesting provider on this date.  Electronically Signed: Jacqualine Mau, NP 02/03/2020, 10:18 AM   I spent a total of  30 Minutes   in face to face in clinical consultation, greater than 50% of which was counseling/coordinating care for bone marrow biopsy

## 2020-02-03 NOTE — Procedures (Signed)
Interventional Radiology Procedure Note  Procedure: CT RT ILIAC BM ASP AND CORE BX    Complications: None  Estimated Blood Loss:  MIN  Findings: 11 G CORE AND ASP    M. Daryll Brod, MD

## 2020-02-05 LAB — SURGICAL PATHOLOGY

## 2020-02-11 ENCOUNTER — Encounter (HOSPITAL_COMMUNITY): Payer: Self-pay | Admitting: Internal Medicine

## 2020-02-16 ENCOUNTER — Encounter (HOSPITAL_COMMUNITY): Payer: Self-pay | Admitting: Internal Medicine

## 2020-04-19 DIAGNOSIS — Z20822 Contact with and (suspected) exposure to covid-19: Secondary | ICD-10-CM | POA: Diagnosis not present

## 2020-04-19 DIAGNOSIS — N390 Urinary tract infection, site not specified: Secondary | ICD-10-CM | POA: Diagnosis not present

## 2020-04-19 DIAGNOSIS — I10 Essential (primary) hypertension: Secondary | ICD-10-CM | POA: Diagnosis not present

## 2020-04-19 DIAGNOSIS — E1165 Type 2 diabetes mellitus with hyperglycemia: Secondary | ICD-10-CM | POA: Diagnosis not present

## 2020-04-19 DIAGNOSIS — M791 Myalgia, unspecified site: Secondary | ICD-10-CM | POA: Diagnosis not present

## 2020-04-19 DIAGNOSIS — R0902 Hypoxemia: Secondary | ICD-10-CM | POA: Diagnosis not present

## 2020-04-19 DIAGNOSIS — R609 Edema, unspecified: Secondary | ICD-10-CM | POA: Diagnosis not present

## 2020-04-19 DIAGNOSIS — R58 Hemorrhage, not elsewhere classified: Secondary | ICD-10-CM | POA: Diagnosis not present

## 2020-04-19 DIAGNOSIS — R319 Hematuria, unspecified: Secondary | ICD-10-CM | POA: Diagnosis not present

## 2020-04-19 DIAGNOSIS — I509 Heart failure, unspecified: Secondary | ICD-10-CM | POA: Diagnosis not present

## 2020-06-26 ENCOUNTER — Emergency Department (HOSPITAL_COMMUNITY)
Admission: EM | Admit: 2020-06-26 | Discharge: 2020-06-26 | Disposition: A | Discharge status: Home / Self Care | Payer: No Typology Code available for payment source | Attending: Emergency Medicine | Admitting: Emergency Medicine

## 2020-06-26 ENCOUNTER — Encounter (HOSPITAL_COMMUNITY): Payer: Self-pay | Admitting: Emergency Medicine

## 2020-06-26 ENCOUNTER — Emergency Department (HOSPITAL_COMMUNITY): Payer: No Typology Code available for payment source

## 2020-06-26 ENCOUNTER — Other Ambulatory Visit: Payer: Self-pay

## 2020-06-26 DIAGNOSIS — Y92410 Unspecified street and highway as the place of occurrence of the external cause: Secondary | ICD-10-CM | POA: Insufficient documentation

## 2020-06-26 DIAGNOSIS — Z79899 Other long term (current) drug therapy: Secondary | ICD-10-CM | POA: Diagnosis not present

## 2020-06-26 DIAGNOSIS — Z7901 Long term (current) use of anticoagulants: Secondary | ICD-10-CM | POA: Diagnosis not present

## 2020-06-26 DIAGNOSIS — S81812A Laceration without foreign body, left lower leg, initial encounter: Secondary | ICD-10-CM | POA: Diagnosis not present

## 2020-06-26 DIAGNOSIS — E1122 Type 2 diabetes mellitus with diabetic chronic kidney disease: Secondary | ICD-10-CM | POA: Diagnosis not present

## 2020-06-26 DIAGNOSIS — R519 Headache, unspecified: Secondary | ICD-10-CM | POA: Insufficient documentation

## 2020-06-26 DIAGNOSIS — S39012A Strain of muscle, fascia and tendon of lower back, initial encounter: Secondary | ICD-10-CM | POA: Insufficient documentation

## 2020-06-26 DIAGNOSIS — Z794 Long term (current) use of insulin: Secondary | ICD-10-CM | POA: Diagnosis not present

## 2020-06-26 DIAGNOSIS — I129 Hypertensive chronic kidney disease with stage 1 through stage 4 chronic kidney disease, or unspecified chronic kidney disease: Secondary | ICD-10-CM | POA: Diagnosis not present

## 2020-06-26 DIAGNOSIS — S0031XA Abrasion of nose, initial encounter: Secondary | ICD-10-CM | POA: Diagnosis not present

## 2020-06-26 DIAGNOSIS — N1832 Chronic kidney disease, stage 3b: Secondary | ICD-10-CM | POA: Insufficient documentation

## 2020-06-26 DIAGNOSIS — I4891 Unspecified atrial fibrillation: Secondary | ICD-10-CM | POA: Insufficient documentation

## 2020-06-26 DIAGNOSIS — S8992XA Unspecified injury of left lower leg, initial encounter: Secondary | ICD-10-CM | POA: Diagnosis present

## 2020-06-26 MED ORDER — ACETAMINOPHEN 325 MG PO TABS: 650.0000 mg | ORAL_TABLET | Freq: Four times a day (QID) | ORAL | 0 refills | Status: AC | PRN

## 2020-06-26 MED ORDER — CYCLOBENZAPRINE HCL 10 MG PO TABS: 10.0000 mg | ORAL_TABLET | Freq: Every evening | ORAL | 0 refills | Status: DC | PRN

## 2020-06-26 NOTE — Discharge Instructions (Addendum)
Please keep the bandages on your left leg for 2 days.  Afterwards you can take off the bandage and shower or bathe normally with soap and water.  You will likely have back and neck soreness and muscle spasms for the next week.  I prescribed you Flexeril, which is a muscle relaxer.  This medicine may make you sleepy.  It may make you dizzy. Try to take it only at night before bedtime, IF NEEDED.   You can also take Tylenol for pain regularly.  I prescribed both to your pharmacy at [facility] in [place], [place].  We talked about the "spot" on your lumbar spine (lower back) on your CT scan.  This may be benign, but it may also be cancer.  Please call your primary care provider about this is issue.  We recommend you get an MRI of your lumbar spine with and without contrast.  You can bring the printed copy of your CT scan (included here) to your doctor's office.
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
## 2020-07-15 DIAGNOSIS — R6889 Other general symptoms and signs: Secondary | ICD-10-CM | POA: Diagnosis not present

## 2020-09-14 ENCOUNTER — Other Ambulatory Visit (HOSPITAL_COMMUNITY): Payer: Self-pay | Admitting: Student in an Organized Health Care Education/Training Program

## 2020-09-14 ENCOUNTER — Other Ambulatory Visit: Payer: Self-pay | Admitting: Student in an Organized Health Care Education/Training Program

## 2020-09-14 DIAGNOSIS — D472 Monoclonal gammopathy: Secondary | ICD-10-CM

## 2020-09-20 DIAGNOSIS — K5730 Diverticulosis of large intestine without perforation or abscess without bleeding: Secondary | ICD-10-CM | POA: Diagnosis not present

## 2020-09-20 DIAGNOSIS — D123 Benign neoplasm of transverse colon: Secondary | ICD-10-CM | POA: Diagnosis not present

## 2020-09-20 DIAGNOSIS — K635 Polyp of colon: Secondary | ICD-10-CM | POA: Diagnosis not present

## 2020-09-20 DIAGNOSIS — Z1211 Encounter for screening for malignant neoplasm of colon: Secondary | ICD-10-CM | POA: Diagnosis not present

## 2020-09-20 DIAGNOSIS — D122 Benign neoplasm of ascending colon: Secondary | ICD-10-CM | POA: Diagnosis not present

## 2020-09-20 DIAGNOSIS — E1165 Type 2 diabetes mellitus with hyperglycemia: Secondary | ICD-10-CM | POA: Diagnosis not present

## 2020-09-20 DIAGNOSIS — D125 Benign neoplasm of sigmoid colon: Secondary | ICD-10-CM | POA: Diagnosis not present

## 2020-09-20 DIAGNOSIS — R1012 Left upper quadrant pain: Secondary | ICD-10-CM | POA: Diagnosis not present

## 2020-09-20 DIAGNOSIS — Z7901 Long term (current) use of anticoagulants: Secondary | ICD-10-CM | POA: Diagnosis not present

## 2020-09-20 DIAGNOSIS — K648 Other hemorrhoids: Secondary | ICD-10-CM | POA: Diagnosis not present

## 2020-09-20 DIAGNOSIS — Z86010 Personal history of colonic polyps: Secondary | ICD-10-CM | POA: Diagnosis not present

## 2020-10-21 ENCOUNTER — Other Ambulatory Visit: Payer: Self-pay

## 2020-10-21 ENCOUNTER — Ambulatory Visit (HOSPITAL_COMMUNITY)
Admission: RE | Admit: 2020-10-21 | Discharge: 2020-10-21 | Disposition: A | Discharge status: Home / Self Care | Payer: No Typology Code available for payment source | Source: Ambulatory Visit | Attending: Physician Assistant | Admitting: Physician Assistant

## 2020-10-21 DIAGNOSIS — Z950 Presence of cardiac pacemaker: Secondary | ICD-10-CM | POA: Insufficient documentation

## 2020-10-21 DIAGNOSIS — D472 Monoclonal gammopathy: Secondary | ICD-10-CM | POA: Insufficient documentation

## 2020-10-21 DIAGNOSIS — M47816 Spondylosis without myelopathy or radiculopathy, lumbar region: Secondary | ICD-10-CM | POA: Insufficient documentation

## 2020-10-21 DIAGNOSIS — M48061 Spinal stenosis, lumbar region without neurogenic claudication: Secondary | ICD-10-CM | POA: Insufficient documentation

## 2020-10-21 MED ORDER — GADOBUTROL 1 MMOL/ML IV SOLN
10.0000 mL | Freq: Once | INTRAVENOUS | Status: AC | PRN
  Administered 2020-10-21: 10 mL via INTRAVENOUS

## 2020-10-21 NOTE — Progress Notes (Signed)
Per order,  Changed device settings for MRI to DOO at 90 bpm Will program device back to pre-MRI settings after completion of exam, and send transmission.         

## 2020-11-12 ENCOUNTER — Other Ambulatory Visit: Payer: Self-pay | Admitting: Student in an Organized Health Care Education/Training Program

## 2020-11-12 ENCOUNTER — Other Ambulatory Visit (HOSPITAL_COMMUNITY): Payer: Self-pay | Admitting: Student in an Organized Health Care Education/Training Program

## 2020-11-12 DIAGNOSIS — M899 Disorder of bone, unspecified: Secondary | ICD-10-CM

## 2020-11-29 ENCOUNTER — Ambulatory Visit (HOSPITAL_COMMUNITY): Payer: No Typology Code available for payment source

## 2020-11-29 ENCOUNTER — Encounter (HOSPITAL_COMMUNITY): Payer: Self-pay

## 2020-12-13 ENCOUNTER — Ambulatory Visit (HOSPITAL_COMMUNITY): Payer: No Typology Code available for payment source

## 2020-12-13 ENCOUNTER — Encounter (HOSPITAL_COMMUNITY): Payer: Self-pay

## 2020-12-14 ENCOUNTER — Ambulatory Visit (HOSPITAL_COMMUNITY)
Admission: RE | Admit: 2020-12-14 | Discharge: 2020-12-14 | Disposition: A | Discharge status: Home / Self Care | Payer: No Typology Code available for payment source | Source: Ambulatory Visit | Attending: Student in an Organized Health Care Education/Training Program | Admitting: Student in an Organized Health Care Education/Training Program

## 2020-12-14 DIAGNOSIS — M899 Disorder of bone, unspecified: Secondary | ICD-10-CM | POA: Diagnosis not present

## 2020-12-14 LAB — GLUCOSE, CAPILLARY
Glucose-Capillary: 258 mg/dL — ABNORMAL HIGH (ref 70–99)
Glucose-Capillary: 263 mg/dL — ABNORMAL HIGH (ref 70–99)
Glucose-Capillary: 280 mg/dL — ABNORMAL HIGH (ref 70–99)

## 2020-12-14 MED ORDER — FLUDEOXYGLUCOSE F - 18 (FDG) INJECTION
16.0000 | Freq: Once | INTRAVENOUS | Status: AC | PRN
  Administered 2020-12-14: 16 via INTRAVENOUS

## 2021-06-07 ENCOUNTER — Other Ambulatory Visit: Payer: Self-pay | Admitting: Student in an Organized Health Care Education/Training Program

## 2021-06-07 DIAGNOSIS — D472 Monoclonal gammopathy: Secondary | ICD-10-CM

## 2021-06-08 ENCOUNTER — Other Ambulatory Visit: Payer: Self-pay | Admitting: Student in an Organized Health Care Education/Training Program

## 2021-06-08 DIAGNOSIS — M899 Disorder of bone, unspecified: Secondary | ICD-10-CM

## 2021-06-08 DIAGNOSIS — D472 Monoclonal gammopathy: Secondary | ICD-10-CM

## 2021-06-22 ENCOUNTER — Ambulatory Visit (HOSPITAL_COMMUNITY)
Admission: RE | Admit: 2021-06-22 | Discharge: 2021-06-22 | Disposition: A | Discharge status: Home / Self Care | Payer: No Typology Code available for payment source | Source: Ambulatory Visit | Attending: Student in an Organized Health Care Education/Training Program | Admitting: Student in an Organized Health Care Education/Training Program

## 2021-06-22 DIAGNOSIS — D472 Monoclonal gammopathy: Secondary | ICD-10-CM | POA: Diagnosis present

## 2021-06-22 MED ORDER — GADOBUTROL 1 MMOL/ML IV SOLN
10.0000 mL | Freq: Once | INTRAVENOUS | Status: AC | PRN
  Administered 2021-06-22: 10 mL via INTRAVENOUS

## 2021-06-22 NOTE — Progress Notes (Signed)
Patient here today at [facility] for Lumbar spine w wo contrast. Patient has medtronic device and is followed at the [place]. CLE sent. Orders received for DOO 95. Will re-program once scan is completed ?

## 2021-07-14 ENCOUNTER — Other Ambulatory Visit: Payer: Self-pay | Admitting: Orthopedic Surgery

## 2021-07-14 DIAGNOSIS — M25512 Pain in left shoulder: Secondary | ICD-10-CM

## 2021-08-02 ENCOUNTER — Other Ambulatory Visit (HOSPITAL_COMMUNITY): Payer: No Typology Code available for payment source

## 2021-09-06 DIAGNOSIS — R21 Rash and other nonspecific skin eruption: Secondary | ICD-10-CM | POA: Diagnosis not present

## 2021-09-06 DIAGNOSIS — E118 Type 2 diabetes mellitus with unspecified complications: Secondary | ICD-10-CM | POA: Diagnosis not present

## 2021-09-07 ENCOUNTER — Ambulatory Visit (HOSPITAL_COMMUNITY)
Admission: RE | Admit: 2021-09-07 | Discharge: 2021-09-07 | Disposition: A | Discharge status: Home / Self Care | Payer: No Typology Code available for payment source | Source: Ambulatory Visit | Attending: Orthopedic Surgery | Admitting: Orthopedic Surgery

## 2021-09-07 DIAGNOSIS — M25512 Pain in left shoulder: Secondary | ICD-10-CM | POA: Diagnosis present

## 2021-09-07 NOTE — Progress Notes (Signed)
Informed of MRI for today.   Device system confirmed to be MRI conditional, with implant date > 6 weeks ago, and no evidence of abandoned or epicardial leads in review of most recent CXR Interrogation from today reviewed, pt is currently AP-VP at ~80-85 bpm Change device settings for MRI to DOO at 95 bpm  Tachy-therapies to off if applicable.  Program device back to pre-MRI settings after completion of exam.  Annamaria Helling  09/07/2021 12:53 PM

## 2021-09-07 NOTE — Progress Notes (Signed)
Patient here today at [facility] for MRI left shoulder wo contrast. Patient has medtronic device. CLE sent. Orders received for DOO 95. Will re-program once scan is completed.

## 2021-09-12 ENCOUNTER — Other Ambulatory Visit (HOSPITAL_COMMUNITY): Payer: No Typology Code available for payment source

## 2021-09-12 DIAGNOSIS — K219 Gastro-esophageal reflux disease without esophagitis: Secondary | ICD-10-CM | POA: Diagnosis not present

## 2021-09-12 DIAGNOSIS — R9431 Abnormal electrocardiogram [ECG] [EKG]: Secondary | ICD-10-CM | POA: Diagnosis not present

## 2021-09-12 DIAGNOSIS — L739 Follicular disorder, unspecified: Secondary | ICD-10-CM | POA: Diagnosis not present

## 2021-09-12 DIAGNOSIS — E785 Hyperlipidemia, unspecified: Secondary | ICD-10-CM | POA: Diagnosis not present

## 2021-09-12 DIAGNOSIS — I10 Essential (primary) hypertension: Secondary | ICD-10-CM | POA: Diagnosis not present

## 2021-09-12 DIAGNOSIS — E119 Type 2 diabetes mellitus without complications: Secondary | ICD-10-CM | POA: Diagnosis not present

## 2021-09-12 DIAGNOSIS — Z79899 Other long term (current) drug therapy: Secondary | ICD-10-CM | POA: Diagnosis not present

## 2021-09-12 DIAGNOSIS — Z7901 Long term (current) use of anticoagulants: Secondary | ICD-10-CM | POA: Diagnosis not present

## 2021-09-12 DIAGNOSIS — Z794 Long term (current) use of insulin: Secondary | ICD-10-CM | POA: Diagnosis not present

## 2021-10-12 ENCOUNTER — Emergency Department (HOSPITAL_COMMUNITY): Payer: No Typology Code available for payment source

## 2021-10-12 ENCOUNTER — Inpatient Hospital Stay (HOSPITAL_COMMUNITY)
Admission: EM | Admit: 2021-10-12 | Discharge: 2021-10-19 | DRG: 683 | Disposition: A | Discharge status: Home Health | Payer: No Typology Code available for payment source | Attending: Internal Medicine | Admitting: Internal Medicine

## 2021-10-12 ENCOUNTER — Encounter (HOSPITAL_COMMUNITY): Payer: Self-pay | Admitting: Internal Medicine

## 2021-10-12 ENCOUNTER — Other Ambulatory Visit: Payer: Self-pay

## 2021-10-12 DIAGNOSIS — E869 Volume depletion, unspecified: Secondary | ICD-10-CM | POA: Diagnosis present

## 2021-10-12 DIAGNOSIS — G4730 Sleep apnea, unspecified: Secondary | ICD-10-CM

## 2021-10-12 DIAGNOSIS — E1122 Type 2 diabetes mellitus with diabetic chronic kidney disease: Secondary | ICD-10-CM | POA: Diagnosis present

## 2021-10-12 DIAGNOSIS — E876 Hypokalemia: Secondary | ICD-10-CM | POA: Diagnosis not present

## 2021-10-12 DIAGNOSIS — Z7901 Long term (current) use of anticoagulants: Secondary | ICD-10-CM

## 2021-10-12 DIAGNOSIS — E785 Hyperlipidemia, unspecified: Secondary | ICD-10-CM | POA: Diagnosis present

## 2021-10-12 DIAGNOSIS — K567 Ileus, unspecified: Secondary | ICD-10-CM | POA: Diagnosis not present

## 2021-10-12 DIAGNOSIS — R112 Nausea with vomiting, unspecified: Secondary | ICD-10-CM

## 2021-10-12 DIAGNOSIS — Z20822 Contact with and (suspected) exposure to covid-19: Secondary | ICD-10-CM | POA: Diagnosis present

## 2021-10-12 DIAGNOSIS — S92322A Displaced fracture of second metatarsal bone, left foot, initial encounter for closed fracture: Secondary | ICD-10-CM | POA: Diagnosis present

## 2021-10-12 DIAGNOSIS — Z79899 Other long term (current) drug therapy: Secondary | ICD-10-CM

## 2021-10-12 DIAGNOSIS — L89152 Pressure ulcer of sacral region, stage 2: Secondary | ICD-10-CM | POA: Diagnosis present

## 2021-10-12 DIAGNOSIS — Z6841 Body Mass Index (BMI) 40.0 and over, adult: Secondary | ICD-10-CM

## 2021-10-12 DIAGNOSIS — Z794 Long term (current) use of insulin: Secondary | ICD-10-CM | POA: Diagnosis not present

## 2021-10-12 DIAGNOSIS — E1169 Type 2 diabetes mellitus with other specified complication: Secondary | ICD-10-CM | POA: Diagnosis present

## 2021-10-12 DIAGNOSIS — W1830XA Fall on same level, unspecified, initial encounter: Secondary | ICD-10-CM | POA: Diagnosis present

## 2021-10-12 DIAGNOSIS — H9192 Unspecified hearing loss, left ear: Secondary | ICD-10-CM | POA: Diagnosis present

## 2021-10-12 DIAGNOSIS — E872 Acidosis, unspecified: Secondary | ICD-10-CM | POA: Diagnosis present

## 2021-10-12 DIAGNOSIS — D649 Anemia, unspecified: Secondary | ICD-10-CM | POA: Diagnosis present

## 2021-10-12 DIAGNOSIS — I4891 Unspecified atrial fibrillation: Secondary | ICD-10-CM | POA: Diagnosis not present

## 2021-10-12 DIAGNOSIS — W19XXXA Unspecified fall, initial encounter: Secondary | ICD-10-CM | POA: Diagnosis not present

## 2021-10-12 DIAGNOSIS — I959 Hypotension, unspecified: Secondary | ICD-10-CM | POA: Diagnosis present

## 2021-10-12 DIAGNOSIS — N400 Enlarged prostate without lower urinary tract symptoms: Secondary | ICD-10-CM | POA: Diagnosis present

## 2021-10-12 DIAGNOSIS — I10 Essential (primary) hypertension: Secondary | ICD-10-CM | POA: Diagnosis present

## 2021-10-12 DIAGNOSIS — Z7189 Other specified counseling: Secondary | ICD-10-CM

## 2021-10-12 DIAGNOSIS — N1832 Chronic kidney disease, stage 3b: Secondary | ICD-10-CM

## 2021-10-12 DIAGNOSIS — Z79890 Hormone replacement therapy (postmenopausal): Secondary | ICD-10-CM

## 2021-10-12 DIAGNOSIS — I129 Hypertensive chronic kidney disease with stage 1 through stage 4 chronic kidney disease, or unspecified chronic kidney disease: Secondary | ICD-10-CM | POA: Diagnosis present

## 2021-10-12 DIAGNOSIS — I495 Sick sinus syndrome: Secondary | ICD-10-CM | POA: Diagnosis present

## 2021-10-12 DIAGNOSIS — Z905 Acquired absence of kidney: Secondary | ICD-10-CM

## 2021-10-12 DIAGNOSIS — S098XXA Other specified injuries of head, initial encounter: Secondary | ICD-10-CM

## 2021-10-12 DIAGNOSIS — S92309A Fracture of unspecified metatarsal bone(s), unspecified foot, initial encounter for closed fracture: Secondary | ICD-10-CM

## 2021-10-12 DIAGNOSIS — S92332A Displaced fracture of third metatarsal bone, left foot, initial encounter for closed fracture: Secondary | ICD-10-CM | POA: Diagnosis present

## 2021-10-12 DIAGNOSIS — N179 Acute kidney failure, unspecified: Secondary | ICD-10-CM | POA: Diagnosis present

## 2021-10-12 DIAGNOSIS — Y92009 Unspecified place in unspecified non-institutional (private) residence as the place of occurrence of the external cause: Secondary | ICD-10-CM | POA: Diagnosis not present

## 2021-10-12 DIAGNOSIS — I480 Paroxysmal atrial fibrillation: Secondary | ICD-10-CM | POA: Diagnosis present

## 2021-10-12 DIAGNOSIS — S82832A Other fracture of upper and lower end of left fibula, initial encounter for closed fracture: Secondary | ICD-10-CM | POA: Diagnosis not present

## 2021-10-12 DIAGNOSIS — H9190 Unspecified hearing loss, unspecified ear: Secondary | ICD-10-CM

## 2021-10-12 DIAGNOSIS — S0181XA Laceration without foreign body of other part of head, initial encounter: Secondary | ICD-10-CM | POA: Diagnosis present

## 2021-10-12 DIAGNOSIS — E039 Hypothyroidism, unspecified: Secondary | ICD-10-CM | POA: Diagnosis present

## 2021-10-12 DIAGNOSIS — D472 Monoclonal gammopathy: Secondary | ICD-10-CM | POA: Diagnosis present

## 2021-10-12 DIAGNOSIS — S92302A Fracture of unspecified metatarsal bone(s), left foot, initial encounter for closed fracture: Secondary | ICD-10-CM

## 2021-10-12 DIAGNOSIS — S0993XA Unspecified injury of face, initial encounter: Secondary | ICD-10-CM

## 2021-10-12 DIAGNOSIS — L8990 Pressure ulcer of unspecified site, unspecified stage: Secondary | ICD-10-CM | POA: Insufficient documentation

## 2021-10-12 DIAGNOSIS — S92352A Displaced fracture of fifth metatarsal bone, left foot, initial encounter for closed fracture: Secondary | ICD-10-CM | POA: Diagnosis present

## 2021-10-12 DIAGNOSIS — R55 Syncope and collapse: Secondary | ICD-10-CM | POA: Diagnosis not present

## 2021-10-12 DIAGNOSIS — S92342A Displaced fracture of fourth metatarsal bone, left foot, initial encounter for closed fracture: Secondary | ICD-10-CM | POA: Diagnosis present

## 2021-10-12 DIAGNOSIS — K2270 Barrett's esophagus without dysplasia: Secondary | ICD-10-CM | POA: Diagnosis not present

## 2021-10-12 DIAGNOSIS — S82872A Displaced pilon fracture of left tibia, initial encounter for closed fracture: Secondary | ICD-10-CM | POA: Diagnosis not present

## 2021-10-12 DIAGNOSIS — E669 Obesity, unspecified: Secondary | ICD-10-CM | POA: Diagnosis not present

## 2021-10-12 DIAGNOSIS — I159 Secondary hypertension, unspecified: Secondary | ICD-10-CM

## 2021-10-12 DIAGNOSIS — Z950 Presence of cardiac pacemaker: Secondary | ICD-10-CM

## 2021-10-12 DIAGNOSIS — S8252XA Displaced fracture of medial malleolus of left tibia, initial encounter for closed fracture: Secondary | ICD-10-CM | POA: Diagnosis present

## 2021-10-12 LAB — URINALYSIS, ROUTINE W REFLEX MICROSCOPIC
Bilirubin Urine: NEGATIVE
Glucose, UA: NEGATIVE mg/dL
Hgb urine dipstick: NEGATIVE
Ketones, ur: NEGATIVE mg/dL
Leukocytes,Ua: NEGATIVE
Nitrite: NEGATIVE
Protein, ur: NEGATIVE mg/dL
Specific Gravity, Urine: 1.016 (ref 1.005–1.030)
pH: 5 (ref 5.0–8.0)

## 2021-10-12 LAB — CBC
HCT: 36.2 % — ABNORMAL LOW (ref 39.0–52.0)
Hemoglobin: 12 g/dL — ABNORMAL LOW (ref 13.0–17.0)
MCH: 31.5 pg (ref 26.0–34.0)
MCHC: 33.1 g/dL (ref 30.0–36.0)
MCV: 95 fL (ref 80.0–100.0)
Platelets: 173 10*3/uL (ref 150–400)
RBC: 3.81 MIL/uL — ABNORMAL LOW (ref 4.22–5.81)
RDW: 15.6 % — ABNORMAL HIGH (ref 11.5–15.5)
WBC: 10.1 10*3/uL (ref 4.0–10.5)
nRBC: 0 % (ref 0.0–0.2)

## 2021-10-12 LAB — I-STAT CHEM 8, ED
BUN: 31 mg/dL — ABNORMAL HIGH (ref 8–23)
Calcium, Ion: 1.05 mmol/L — ABNORMAL LOW (ref 1.15–1.40)
Chloride: 99 mmol/L (ref 98–111)
Creatinine, Ser: 2.1 mg/dL — ABNORMAL HIGH (ref 0.61–1.24)
Glucose, Bld: 163 mg/dL — ABNORMAL HIGH (ref 70–99)
HCT: 34 % — ABNORMAL LOW (ref 39.0–52.0)
Hemoglobin: 11.6 g/dL — ABNORMAL LOW (ref 13.0–17.0)
Potassium: 4.1 mmol/L (ref 3.5–5.1)
Sodium: 138 mmol/L (ref 135–145)
TCO2: 24 mmol/L (ref 22–32)

## 2021-10-12 LAB — HEMOGLOBIN A1C
Hgb A1c MFr Bld: 9.2 % — ABNORMAL HIGH (ref 4.8–5.6)
Mean Plasma Glucose: 217.34 mg/dL

## 2021-10-12 LAB — TSH: TSH: 0.77 u[IU]/mL (ref 0.350–4.500)

## 2021-10-12 LAB — COMPREHENSIVE METABOLIC PANEL
ALT: 25 U/L (ref 0–44)
AST: 36 U/L (ref 15–41)
Albumin: 2.8 g/dL — ABNORMAL LOW (ref 3.5–5.0)
Alkaline Phosphatase: 178 U/L — ABNORMAL HIGH (ref 38–126)
Anion gap: 12 (ref 5–15)
BUN: 29 mg/dL — ABNORMAL HIGH (ref 8–23)
CO2: 24 mmol/L (ref 22–32)
Calcium: 8.5 mg/dL — ABNORMAL LOW (ref 8.9–10.3)
Chloride: 101 mmol/L (ref 98–111)
Creatinine, Ser: 2.13 mg/dL — ABNORMAL HIGH (ref 0.61–1.24)
GFR, Estimated: 31 mL/min — ABNORMAL LOW (ref >60)
Glucose, Bld: 168 mg/dL — ABNORMAL HIGH (ref 70–99)
Potassium: 4.1 mmol/L (ref 3.5–5.1)
Sodium: 137 mmol/L (ref 135–145)
Total Bilirubin: 0.9 mg/dL (ref 0.3–1.2)
Total Protein: 6.2 g/dL — ABNORMAL LOW (ref 6.5–8.1)

## 2021-10-12 LAB — SAMPLE TO BLOOD BANK

## 2021-10-12 LAB — ETHANOL: Alcohol, Ethyl (B): <10 mg/dL (ref <10)

## 2021-10-12 LAB — TROPONIN I (HIGH SENSITIVITY)
Troponin I (High Sensitivity): 13 ng/L (ref <18)
Troponin I (High Sensitivity): 14 ng/L (ref <18)

## 2021-10-12 LAB — LACTIC ACID, PLASMA
Lactic Acid, Venous: 3.6 mmol/L (ref 0.5–1.9)
Lactic Acid, Venous: 2.4 mmol/L (ref 0.5–1.9)

## 2021-10-12 LAB — PROTIME-INR
INR: 1.7 — ABNORMAL HIGH (ref 0.8–1.2)
Prothrombin Time: 19.4 seconds — ABNORMAL HIGH (ref 11.4–15.2)

## 2021-10-12 LAB — RESP PANEL BY RT-PCR (FLU A&B, COVID) ARPGX2
Influenza A by PCR: NEGATIVE
Influenza B by PCR: NEGATIVE
SARS Coronavirus 2 by RT PCR: NEGATIVE

## 2021-10-12 LAB — CBG MONITORING, ED: Glucose-Capillary: 150 mg/dL — ABNORMAL HIGH (ref 70–99)

## 2021-10-12 MED ORDER — OXYCODONE HCL 5 MG PO TABS: 5.0000 mg | ORAL_TABLET | ORAL | Status: DC | PRN

## 2021-10-12 MED ORDER — ACETAMINOPHEN 325 MG PO TABS
650.0000 mg | ORAL_TABLET | Freq: Once | ORAL | Status: AC
  Administered 2021-10-12: 650 mg via ORAL
  Filled 2021-10-12: qty 2

## 2021-10-12 MED ORDER — FINASTERIDE 5 MG PO TABS
5.0000 mg | ORAL_TABLET | Freq: Every day | ORAL | Status: DC
  Administered 2021-10-13 – 2021-10-19 (×7): 5 mg via ORAL
  Filled 2021-10-12 (×7): qty 1

## 2021-10-12 MED ORDER — ATORVASTATIN CALCIUM 80 MG PO TABS
80.0000 mg | ORAL_TABLET | Freq: Every day | ORAL | Status: DC
  Administered 2021-10-13: 80 mg via ORAL
  Filled 2021-10-12: qty 1

## 2021-10-12 MED ORDER — CITALOPRAM HYDROBROMIDE 20 MG PO TABS
10.0000 mg | ORAL_TABLET | Freq: Every day | ORAL | Status: DC
  Administered 2021-10-13 – 2021-10-19 (×7): 10 mg via ORAL
  Filled 2021-10-12 (×7): qty 1

## 2021-10-12 MED ORDER — MEMANTINE HCL 10 MG PO TABS
10.0000 mg | ORAL_TABLET | Freq: Two times a day (BID) | ORAL | Status: DC
  Administered 2021-10-12 – 2021-10-19 (×14): 10 mg via ORAL
  Filled 2021-10-12 (×15): qty 1

## 2021-10-12 MED ORDER — SODIUM CHLORIDE 0.9% FLUSH
3.0000 mL | Freq: Two times a day (BID) | INTRAVENOUS | Status: DC
Start: 2021-10-12 — End: 2021-10-19
  Administered 2021-10-14 – 2021-10-19 (×3): 3 mL via INTRAVENOUS

## 2021-10-12 MED ORDER — GABAPENTIN 400 MG PO CAPS
400.0000 mg | ORAL_CAPSULE | Freq: Two times a day (BID) | ORAL | Status: DC
  Administered 2021-10-12 – 2021-10-19 (×14): 400 mg via ORAL
  Filled 2021-10-12 (×13): qty 1

## 2021-10-12 MED ORDER — INSULIN ASPART 100 UNIT/ML IJ SOLN
0.0000 [IU] | Freq: Every day | INTRAMUSCULAR | Status: DC
  Administered 2021-10-13: 2 [IU] via SUBCUTANEOUS

## 2021-10-12 MED ORDER — IOHEXOL 350 MG/ML SOLN
80.0000 mL | Freq: Once | INTRAVENOUS | Status: AC | PRN
  Administered 2021-10-12: 80 mL via INTRAVENOUS

## 2021-10-12 MED ORDER — LEVOTHYROXINE SODIUM 100 MCG PO TABS: 100.0000 ug | ORAL_TABLET | Freq: Two times a day (BID) | ORAL | Status: DC

## 2021-10-12 MED ORDER — LEVOTHYROXINE SODIUM 100 MCG PO TABS
100.0000 ug | ORAL_TABLET | Freq: Every day | ORAL | Status: DC
  Administered 2021-10-13 – 2021-10-19 (×6): 100 ug via ORAL
  Filled 2021-10-12 (×6): qty 1

## 2021-10-12 MED ORDER — ACETAMINOPHEN 325 MG PO TABS
650.0000 mg | ORAL_TABLET | ORAL | Status: DC | PRN
  Administered 2021-10-12 – 2021-10-17 (×5): 650 mg via ORAL
  Filled 2021-10-12 (×5): qty 2

## 2021-10-12 MED ORDER — INSULIN ASPART 100 UNIT/ML IJ SOLN
0.0000 [IU] | Freq: Three times a day (TID) | INTRAMUSCULAR | Status: DC
  Administered 2021-10-13: 11 [IU] via SUBCUTANEOUS
  Administered 2021-10-13 (×2): 7 [IU] via SUBCUTANEOUS
  Administered 2021-10-14 (×3): 11 [IU] via SUBCUTANEOUS
  Administered 2021-10-15 (×2): 7 [IU] via SUBCUTANEOUS

## 2021-10-12 MED ORDER — LACTATED RINGERS IV SOLN: INTRAVENOUS | Status: DC

## 2021-10-12 NOTE — ED Notes (Signed)
C-collar removed by MD

## 2021-10-12 NOTE — Assessment & Plan Note (Signed)
-   RT eval

## 2021-10-12 NOTE — Assessment & Plan Note (Addendum)
-   A1c 9.2 % - Continue SSI and CBG monitoring - adding back basal; will need adjustment once diet is reinitiated

## 2021-10-12 NOTE — Progress Notes (Signed)
Orthopedic Tech Progress Note Patient Details:  Alfred Meyers 06-03-1945 [id number]  Ortho Devices Type of Ortho Device: CAM walker Ortho Device/Splint Location: LLE Ortho Device/Splint Interventions: Ordered, Application, Adjustment   Post Interventions Patient Tolerated: Well Instructions Provided: Care of device, Adjustment of device  [name] 10/12/2021, 9:49 PM

## 2021-10-12 NOTE — ED Notes (Signed)
Ortho called reference cam boot

## 2021-10-12 NOTE — ED Notes (Addendum)
RN start of shift assessment: Pt is alert and oriented, pupils equal and reactive, 2. Pt is supine in c-collar, conversation appropriate reporting 5/10 pain in left ankle. 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 Small abrasion/laceration noted across bridge of patients nose as well as abrasion to left forehead. Pt provided call bell. Vital signs in normal limits.

## 2021-10-12 NOTE — Assessment & Plan Note (Addendum)
-   Hold Lipitor for now while n.p.o.

## 2021-10-12 NOTE — Assessment & Plan Note (Signed)
-   Lactate elevation suspected in setting of volume depletion/poor perfusion; low suspicion for infection at this time -Initial lactic acid 3.6; downtrended some with IVF; no further trending needed

## 2021-10-12 NOTE — ED Notes (Signed)
Meal tray provided.

## 2021-10-12 NOTE — ED Notes (Signed)
Abrasions noted through out face, 2-3+ pitting edema to bilat lower legs at this time

## 2021-10-12 NOTE — Assessment & Plan Note (Addendum)
-   Initially presented with hypotension, blood pressure has now stabilized

## 2021-10-12 NOTE — Progress Notes (Signed)
Responded to ED page to support pt and staff.  Pt experienced a fall . Pt is ok and being seen by ED staff.  Chaplain available as needed.  Jaclynn Major, [place], B CC, Pager [phone]
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
## 2021-10-12 NOTE — H&P (Signed)
History and Physical    Alfred Meyers  XLK:[id number]  DOB: [birth year]  DOA: 10/12/2021  PCP: [facility] Patient coming from: Home  Chief Complaint: fall  HPI:  Alfred Meyers is a 76 yo male with PMH PAF, DM II, HTN, HLD, MGUS, morbid obesity, CKD 3B who presented to the hospital after a fall at home. Patient was trying to clean up the house some recently as his wife was planning on returning home and the care of hospice.  She has currently been hospitalized for the past 3 days and he has been trying to clean up anticipating her return. He states that he was sitting in his recliner and fell backwards.  He then tried to stand up and walk approximately 10 feet with his walker then fell again.  He is not fully sure how he fell but does state that he felt slightly dizzy and also may have tripped resulting in the fall.  He hit his face upon falling and his left foot.  This occurred approximately 10 AM this morning and patient had already been up for several hours.  He has been eating approximately 2 meals per day but not drinking much fluid while his wife has been hospitalized. He also states he had an appointment at the [facility] in [place] today which he was preparing to go to prior to falling.  He says he recently had an MRI of his left shoulder after a prior fall resulting in pain with that and was going for his follow-up appointment for MRI results.  He is unsure what the MRI showed.  Multiple imaging studies were performed in the ER due to his traumatic fall.  Work-up was essentially negative except for left foot x-ray which showed mildly displaced left 2 through 5 metatarsal head fractures with intra-articular extension at the fifth MTP joint.  Orthopedic surgery was consulted and he was sent for a CT left foot for further evaluation of the fractures as well.  Notable labs included creatinine 2.13, BUN 29.  Lactic acid 3.6.  Troponins were negative x2.  Due to inability to walk, pain  control, AKI, and insufficient help at home he is admitted for IVF, pain management, PT eval.   I have personally briefly reviewed patient's old medical records in [facility] and discussed patient with the ER provider when appropriate/indicated.  Assessment and Plan: * Acute renal failure superimposed on stage 3b chronic kidney disease ([place]) - patient has history of CKD3b. Baseline creat ~ 1.5-1.6, eGFR 44 - patient presents with increase in creat >0.3 mg/dL above baseline, creat increase >1.5x baseline presumed to have occurred within past 7 days PTA -Creatinine 2.13 on admission; given his decreased intake at home, suspect volume depletion/prerenal - Judicious use of fluids given unknown cardiac function.  Follow-up echo as well - Continue fluids for now - Repeat BMP in a.m. - Lactate elevation suspected in setting of volume depletion/poor perfusion; low suspicion for infection at this time  Metatarsal fracture - s/p fall; sounds mechanical but cannot rule out some orthostasis that may have contributed - per xray: "Mildly displaced 2-5 LEFT metatarsal head fractures, with intra-articular extension at the 5th MTP joint" -Orthopedic surgery following, appreciate assistance - Follow-up CT left foot - Hold off on PT/OT eval until cleared by orthopedic surgery for weightbearing status  Fall at home, initial encounter - Suspected mixed component of deconditioning resulting in mechanical fall but may have also had some orthostasis from poor intake - Check orthostatic blood  pressure if able - Eventually will need PT/OT eval - Follow-up echo  Lactic acidosis - Lactate elevation suspected in setting of volume depletion/poor perfusion; low suspicion for infection at this time -Initial lactic acid 3.6 - start IVF - Follow-up repeat lactic  Atrial fibrillation (HCC) - Presented hypotensive and responded to fluids - Hold anticoagulation and rate control agents for now  Normocytic  anemia - Baseline hemoglobin around 11 to 12 g/dL - Currently at baseline - Monitor hemoglobin after fall as patient on anticoagulation at baseline  Hypothyroidism - Odd regimen of Synthroid per med rec - Follow-up TSH - Clarify Synthroid prescription tomorrow if able  Hypertension - Patient presented with hypotension in the ER which was fluid responsive - Hold home medications for now  Sleep apnea - RT eval  Hearing loss - Hearing aid in place in left ear  Barrett's esophagus - no PPI noted   SSS (sick sinus syndrome) ([place]) - 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   Code Status:    Code Status: Full Code  DVT Prophylaxis:  SCDs Start: 10/12/21 1823   Anticipated disposition is to: Pending PT/OT evals  History: Past Medical History:  Diagnosis Date   Atrial fibrillation ([place])    Cellulitis and abscess of right leg 10-/15/2021   Diabetes mellitus type 2 in obese (HCC)    Dyslipidemia    Hypertension    MGUS (monoclonal gammopathy of unknown significance)    Morbid obesity with BMI of 45.0-49.9, adult ([place])    Stage 3b chronic kidney disease ([place]) 12/11/2019    Past Surgical History:  Procedure Laterality Date   APPENDECTOMY     NASAL FRACTURE SURGERY     NEPHRECTOMY  2000   [place]     reports that he has never smoked. He has never used smokeless tobacco. He reports that he does not drink alcohol and does not use drugs.  No Known Allergies  No family history on file.  Home Medications: Prior to Admission medications   Medication Sig Start Date End Date Taking? Authorizing Provider   acetaminophen (TYLENOL) 325 MG tablet Take 2 tablets (650 mg total) by mouth every 6 (six) hours as needed for up to 30 doses for mild pain or moderate pain. 06/26/20  Yes Trifan, Carola Rhine, MD  apixaban (ELIQUIS) 5 MG TABS tablet Take 5 mg by mouth 2 (two) times daily.   Yes [provider]  atorvastatin (LIPITOR) 80 MG tablet Take 80 mg by mouth at bedtime.   Yes [provider]  cetirizine (ZYRTEC) 10 MG tablet Take 10 mg by mouth at bedtime.   Yes [provider]  cholecalciferol (VITAMIN D3) 25 MCG (1000 UNIT) tablet Take 1,000 Units by mouth daily.   Yes [provider]  citalopram (CELEXA) 20 MG tablet Take 10 mg by mouth daily.    Yes [provider]  finasteride (PROSCAR) 5 MG tablet Take 5 mg by mouth daily.   Yes [provider]  gabapentin (NEURONTIN) 400 MG capsule Take 400 mg by mouth 2 (two) times daily.   Yes [provider]  hydrOXYzine (VISTARIL) 100 MG capsule Take  100 mg by mouth as needed for itching or anxiety.   Yes [provider]  insulin aspart protamine - aspart (NOVOLOG 70/30 MIX) (70-30) 100 UNIT/ML FlexPen Inject 70 Units into the skin in the morning and at bedtime.   Yes [provider]  levothyroxine (SYNTHROID) 100 MCG tablet Take 100 mcg by mouth 2 (two) times daily.   Yes [provider]  losartan (COZAAR) 100 MG tablet Take 25 mg by mouth at bedtime.    Yes [provider]  memantine (NAMENDA) 10 MG tablet Take 10 mg by mouth 2 (two) times daily.   Yes [provider]  metoprolol (TOPROL-XL) 200 MG 24 hr tablet Take 100 mg by mouth daily.    Yes [provider]  oxybutynin (DITROPAN) 5 MG tablet Take 5 mg by mouth daily.   Yes [provider]  potassium chloride SA (KLOR-CON) 20 MEQ tablet Take 40 mEq by mouth daily.   Yes [provider]  terazosin (HYTRIN) 5 MG capsule Take 5 mg by mouth at bedtime.   Yes [provider]   torsemide (DEMADEX) 20 MG tablet Take 40 mg by mouth 2 (two) times daily.   Yes [provider]  traMADol (ULTRAM) 50 MG tablet Take 50 mg by mouth daily.   Yes [provider]  allopurinol (ZYLOPRIM) 100 MG tablet Take 100 mg by mouth 2 (two) times daily.  Patient not taking: Reported on 10/12/2021    [provider]  amiodarone (PACERONE) 200 MG tablet Take 200 mg by mouth at bedtime. Patient not taking: Reported on 10/12/2021    [provider]  ARTIFICIAL SALIVA MT Use as directed 4 sprays in the mouth or throat every hour as needed (dry mouth). Patient not taking: Reported on 10/12/2021    [provider]  cadexomer iodine (IODOSORB) 0.9 % gel Apply 1 application topically See admin instructions. Apply a small amount to affected area daily to left foot wound after cleansing foot with carra klens wound cleanser Patient not taking: Reported on 10/12/2021    [provider]  cyclobenzaprine (FLEXERIL) 10 MG tablet Take 1 tablet (10 mg total) by mouth at bedtime as needed for up to 10 doses for muscle spasms. Patient not taking: Reported on 10/12/2021 06/26/20   Wyvonnia Dusky, MD  lidocaine (LIDODERM) 5 % Place 1 patch onto the skin daily. Remove & Discard patch within 12 hours or as directed by MD Patient not taking: Reported on 10/12/2021    [provider]  liraglutide (VICTOZA) 18 MG/3ML SOPN Inject 1 mg into the skin once a week. Verified (49m) weekly. Patient not taking: Reported on 10/12/2021    [provider]  naloxone ([facility] nasal spray 4 mg/0.1 mL Place 1 spray into the nose once. For opioid overdose. Patient not taking: Reported on 10/12/2021    [provider]  Propylene Glycol 0.6 % SOLN Place 1 drop into both eyes in the morning, at noon, and at bedtime. Patient not taking: Reported on 10/12/2021    [provider]  triamcinolone cream (KENALOG) 0.1 % Apply 1 application topically 2 (two) times  daily. Patient not taking: Reported on 10/12/2021    [provider]  warfarin (COUMADIN) 5 MG tablet Take 5-7.5 mg by mouth See admin instructions. 563mfive (5) times per week on Tues, Wed, Thurs, Sat, Sun. 7.64m54mwo (2) times per week on Mon and Friday. [facility] at [facility] KerCartagotient not taking: Reported on  10/12/2021    [provider]    Review of Systems:  Review of Systems  Constitutional:  Positive for malaise/fatigue. Negative for chills and fever.  HENT: Negative.    Eyes: Negative.   Respiratory: Negative.    Cardiovascular:  Positive for leg swelling.  Gastrointestinal: Negative.   Genitourinary: Negative.   Musculoskeletal:  Positive for falls and joint pain.  Skin: Negative.   Neurological:  Positive for weakness. Negative for dizziness, focal weakness and headaches.  Endo/Heme/Allergies: Negative.   Psychiatric/Behavioral: Negative.      Physical Exam:  Vitals:   10/12/21 1600 10/12/21 1630 10/12/21 1645 10/12/21 1700  BP: 118/70 107/63 108/64 108/61  Pulse: 73 70 69 69  Resp: _0 Temp:      TempSrc:      SpO2: 98% 98% 99% 98%  Weight:      Height:       Physical Exam Constitutional:      Appearance: He is obese.     Comments: Chronically ill-appearing elderly gentleman lying in bed in no distress  HENT:     Head:     Comments: Forehead abrasion appreciated    Mouth/Throat:     Mouth: Mucous membranes are dry.  Eyes:     Extraocular Movements: Extraocular movements intact.  Cardiovascular:     Rate and Rhythm: Normal rate and regular rhythm.  Pulmonary:     Effort: Pulmonary effort is normal. No respiratory distress.     Breath sounds: Normal breath sounds. No wheezing.  Abdominal:     General: Bowel sounds are normal. There is no distension.     Palpations: Abdomen is soft.     Tenderness: There is no abdominal tenderness.  Musculoskeletal:     Cervical back: Normal range of motion and neck supple.      Comments: Chronic appearing bilateral lower extremity edema approximately 2-3+.  Decreased active and passive left shoulder ROM limited by pain (bone crepitus appreciated during passive ROM).  Pain in plantar surface of left foot  Skin:    Comments: Chronic stasis changes in bilateral lower extremities  Neurological:     General: No focal deficit present.  Psychiatric:        Mood and Affect: Mood normal.        Behavior: Behavior normal.      Labs on Admission:  I have personally reviewed following labs and imaging studies Results for orders placed or performed during the hospital encounter of 10/12/21 (from the past 24 hour(s))  Resp Panel by RT-PCR (Flu A&B, Covid) Anterior Nasal Swab     Status: None   Collection Time: 10/12/21 11:43 AM   Specimen: Anterior Nasal Swab  Result Value Ref Range   SARS Coronavirus 2 by RT PCR NEGATIVE NEGATIVE   Influenza A by PCR NEGATIVE NEGATIVE   Influenza B by PCR NEGATIVE NEGATIVE  Comprehensive metabolic panel     Status: Abnormal   Collection Time: 10/12/21 11:45 AM  Result Value Ref Range   Sodium 137 135 - 145 mmol/L   Potassium 4.1 3.5 - 5.1 mmol/L   Chloride 101 98 - 111 mmol/L   CO2 24 22 - 32 mmol/L   Glucose, Bld 168 (H) 70 - 99 mg/dL   BUN 29 (H) 8 - 23 mg/dL   Creatinine, Ser 2.13 (H) 0.61 - 1.24 mg/dL   Calcium 8.5 (L) 8.9 - 10.3 mg/dL   Total Protein 6.2 (L) 6.5 - 8.1 g/dL   Albumin  2.8 (L) 3.5 - 5.0 g/dL   AST 36 15 - 41 U/L   ALT 25 0 - 44 U/L   Alkaline Phosphatase 178 (H) 38 - 126 U/L   Total Bilirubin 0.9 0.3 - 1.2 mg/dL   GFR, Estimated 31 (L) >60 mL/min   Anion gap 12 5 - 15  CBC     Status: Abnormal   Collection Time: 10/12/21 11:45 AM  Result Value Ref Range   WBC 10.1 4.0 - 10.5 K/uL   RBC 3.81 (L) 4.22 - 5.81 MIL/uL   Hemoglobin 12.0 (L) 13.0 - 17.0 g/dL   HCT 36.2 (L) 39.0 - 52.0 %   MCV 95.0 80.0 - 100.0 fL   MCH 31.5 26.0 - 34.0 pg   MCHC 33.1 30.0 - 36.0 g/dL   RDW 15.6 (H) 11.5 - 15.5 %   Platelets  173 150 - 400 K/uL   nRBC 0.0 0.0 - 0.2 %  Ethanol     Status: None   Collection Time: 10/12/21 11:45 AM  Result Value Ref Range   Alcohol, Ethyl (B) <10 <10 mg/dL  Lactic acid, plasma     Status: Abnormal   Collection Time: 10/12/21 11:45 AM  Result Value Ref Range   Lactic Acid, Venous 3.6 (HH) 0.5 - 1.9 mmol/L  Protime-INR     Status: Abnormal   Collection Time: 10/12/21 11:45 AM  Result Value Ref Range   Prothrombin Time 19.4 (H) 11.4 - 15.2 seconds   INR 1.7 (H) 0.8 - 1.2  Sample to Blood Bank     Status: None   Collection Time: 10/12/21 11:45 AM  Result Value Ref Range   Blood Bank Specimen SAMPLE AVAILABLE FOR TESTING    Sample Expiration      10/13/2021,2359 Performed at [facility], [address] [place], [place] [address]   Troponin I (High Sensitivity)     Status: None   Collection Time: 10/12/21 11:45 AM  Result Value Ref Range   Troponin I (High Sensitivity) 13 <18 ng/L  I-Stat Chem 8, ED     Status: Abnormal   Collection Time: 10/12/21 11:56 AM  Result Value Ref Range   Sodium 138 135 - 145 mmol/L   Potassium 4.1 3.5 - 5.1 mmol/L   Chloride 99 98 - 111 mmol/L   BUN 31 (H) 8 - 23 mg/dL   Creatinine, Ser 2.10 (H) 0.61 - 1.24 mg/dL   Glucose, Bld 163 (H) 70 - 99 mg/dL   Calcium, Ion 1.05 (L) 1.15 - 1.40 mmol/L   TCO2 24 22 - 32 mmol/L   Hemoglobin 11.6 (L) 13.0 - 17.0 g/dL   HCT 34.0 (L) 39.0 - 52.0 %  Troponin I (High Sensitivity)     Status: None   Collection Time: 10/12/21  2:15 PM  Result Value Ref Range   Troponin I (High Sensitivity) 14 <18 ng/L     Radiological Exams on Admission: CT Angio Chest/Abd/Pel for Dissection W and/or Wo Contrast  Result Date: 10/12/2021 CLINICAL DATA:  Syncope.  Widened mediastinum on chest x-ray. EXAM: CT ANGIOGRAPHY CHEST, ABDOMEN AND PELVIS TECHNIQUE: Non-contrast CT of the chest was initially obtained. Multidetector CT imaging through the chest, abdomen and pelvis was performed using the standard protocol  during bolus administration of intravenous contrast. Multiplanar reconstructed images and MIPs were obtained and reviewed to evaluate the vascular anatomy. RADIATION DOSE REDUCTION: This exam was performed according to the departmental dose-optimization program which includes automated exposure control, adjustment of the mA  and/or kV according to patient size and/or use of iterative reconstruction technique. CONTRAST:  63m OMNIPAQUE IOHEXOL 350 MG/ML SOLN COMPARISON:  None Available. FINDINGS: CTA CHEST FINDINGS Cardiovascular: Preferential opacification of the thoracic aorta. No evidence of thoracic aortic aneurysm or dissection. The heart is mildly enlarged. There is no pericardial effusion. There are atherosclerotic calcifications of the aorta and coronary arteries. Left-sided pacemaker is present with leads ending in the right atrium and right ventricle. There is no large central pulmonary embolism. Mediastinum/Nodes: No enlarged mediastinal, hilar, or axillary lymph nodes. Thyroid gland, trachea, and esophagus demonstrate no significant findings. Lungs/Pleura: There are minimal patchy airspace and ground-glass opacities in the bilateral lower lobes, left greater than right. There is no pleural effusion or pneumothorax. Trachea and central airways are patent. 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 No acute fractures are seen. Review of the MIP images confirms the above findings. CTA ABDOMEN AND PELVIS FINDINGS VASCULAR Aorta: Normal caliber aorta without aneurysm, dissection, vasculitis or significant stenosis. There are atherosclerotic calcifications of the aorta. Celiac: Patent without evidence of aneurysm, dissection, vasculitis or significant stenosis. SMA: Patent without evidence of aneurysm, dissection, vasculitis or significant stenosis. Renals: Right renal artery is patent without evidence of aneurysm, dissection, vasculitis, fibromuscular dysplasia or significant stenosis. Patient is  status post left nephrectomy. The left renal artery is absent. IMA: Patent without evidence of aneurysm, dissection, vasculitis or significant stenosis. Inflow: Patent without evidence of aneurysm, dissection, vasculitis or significant stenosis. Veins: No obvious venous abnormality within the limitations of this arterial phase study. Review of the MIP images confirms the above findings. NON-VASCULAR Hepatobiliary: There is questionable nodular liver contour. No focal liver lesions are seen. Gallstones are present. There is no biliary ductal dilatation. Pancreas: There is a 16 mm rounded hypodense area in the tail the pancreas image 5/146. There is a 2 cm rounded hypodense area in the uncinate process of the pancreas image 5/175. No pancreatic ductal dilatation or surrounding inflammatory changes. Spleen: Normal in size without focal abnormality. 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 Right kidney, right adrenal gland and bladder appear within normal limits. Stomach/Bowel: Stomach is within normal limits. Appendix appears normal. No evidence of bowel wall thickening, distention, or inflammatory changes. Colonic diverticula are present. The appendix is not seen. Lymphatic: No enlarged lymph nodes. Reproductive: Prostate is unremarkable. Other: No ascites.  Small fat containing umbilical hernia. Musculoskeletal: No acute fractures. There is a single rounded sclerotic density in the L4 vertebral body measuring 9 mm. Degenerative changes affect the spine and hips. Review of the MIP images confirms the above findings. IMPRESSION: 1. No evidence for aortic dissection or aneurysm. 2. Minimal patchy airspace and ground-glass opacities in the lung bases may represent atelectasis, mild edema or infection. 3. Cardiomegaly. 4. Questionable nodular liver contour. Correlate clinically for hepatocellular disease. 5. Cholelithiasis. 6. There are 2 hypodense lesions in the pancreas, possibly  cystic. Recommend further evaluation with pancreatic MRI. 7. Left nephrectomy and adrenalectomy. 8. Single small sclerotic density in the L4 vertebral body, indeterminate. Correlate clinically for metastatic disease. Aortic Atherosclerosis (ICD10-I70.0). Electronically Signed   By: ARonney AstersM.D.   On: 10/12/2021 15:21   CT Head Wo Contrast  Result Date: 10/12/2021 CLINICAL DATA:  Fall.   Head, face and neck trauma. EXAM: CT HEAD WITHOUT CONTRAST CT MAXILLOFACIAL WITHOUT CONTRAST CT CERVICAL SPINE WITHOUT CONTRAST TECHNIQUE: Multidetector CT imaging of the head, cervical spine, and maxillofacial structures were performed using the standard protocol without intravenous  contrast. Multiplanar CT image reconstructions of the cervical spine and maxillofacial structures were also generated. RADIATION DOSE REDUCTION: This exam was performed according to the departmental dose-optimization program which includes automated exposure control, adjustment of the mA and/or kV according to patient size and/or use of iterative reconstruction technique. COMPARISON:  None Available. FINDINGS: CT HEAD FINDINGS Brain: No evidence of acute infarction, hemorrhage, hydrocephalus, extra-axial collection or mass lesion/mass effect. Mild cerebral volume loss. Encephalomalacia of the left occipital lobe and advanced chronic microvascular ischemic changes of the periventricular and subcortical white matter, unchanged. Vascular: No hyperdense vessel or unexpected calcification. Skull: Normal. Negative for fracture or focal lesion. Other: None. CT MAXILLOFACIAL FINDINGS Osseous: No fracture or mandibular dislocation. No destructive process. Orbits: Negative. No traumatic or inflammatory finding. Sinuses: Mucosal thickening of the ethmoid air cells, remaining paranasal sinuses and mastoid air cells are clear. Soft tissues: Negative. CT CERVICAL SPINE FINDINGS Alignment: Straightening of the cervical spine. Skull base and vertebrae: No  acute fracture. No primary bone lesion or focal pathologic process. Soft tissues and spinal canal: No prevertebral fluid or swelling. No visible canal hematoma. 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 Associated uncovertebral joint and facet joint arthropathy. C2-C3: No significant disc bulge, spinal canal or neural foraminal stenosis. C3-C4: Disc height loss and uncovertebral joint arthropathy with mild left and moderate right neural foraminal stenosis. Mild bilateral facet joint arthropathy. C4-C5: Disc height loss and uncovertebral joint arthropathy with mild left neural foraminal stenosis. Moderate left facet joint arthropathy. C5-C6: Near complete loss of the disc space with disc osteophyte fight complex with mild narrowing of the spinal canal. Mild-to-moderate bilateral neural foraminal stenosis. C6-C7: Disc height loss and disc osteophyte complex with mild spinal canal stenosis. Mild right neural foraminal stenosis. C7-T1: Disc height loss without significant spinal canal or neural foraminal stenosis. Upper chest: Negative. Other: None IMPRESSION: CT head: 1. No acute intracranial abnormality. 2. Mild cerebral atrophy, advanced chronic microvascular ischemic changes of the white matter. Chronic left occipital lobe infarct. CT maxillofacial: 1. No fracture or dislocation. 2. Orbits are unremarkable.  No significant soft tissue injury. CT cervical spine: 1. No acute fracture or traumatic subluxation. 2. 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 Multilevel facet joint and uncovertebral vertebral joint arthropathy. Electronically Signed   By: Keane Police D.O.   On: 10/12/2021 12:53   CT Cervical Spine Wo Contrast  Result Date: 10/12/2021 CLINICAL DATA:  Fall.   Head, face and neck trauma. EXAM: CT HEAD WITHOUT CONTRAST CT MAXILLOFACIAL WITHOUT CONTRAST CT CERVICAL SPINE  WITHOUT CONTRAST TECHNIQUE: Multidetector CT imaging of the head, cervical spine, and maxillofacial structures were performed using the standard protocol without intravenous contrast. Multiplanar CT image reconstructions of the cervical spine and maxillofacial structures were also generated. RADIATION DOSE REDUCTION: This exam was performed according to the departmental dose-optimization program which includes automated exposure control, adjustment of the mA and/or kV according to patient size and/or use of iterative reconstruction technique. COMPARISON:  None Available. FINDINGS: CT HEAD FINDINGS Brain: No evidence of acute infarction, hemorrhage, hydrocephalus, extra-axial collection or mass lesion/mass effect. Mild cerebral volume loss. Encephalomalacia of the left occipital lobe and advanced chronic microvascular ischemic changes of the periventricular and subcortical white matter, unchanged. Vascular: No hyperdense vessel or unexpected calcification. Skull: Normal. Negative for fracture or focal lesion. Other: None. CT MAXILLOFACIAL FINDINGS Osseous: No fracture or mandibular dislocation. No destructive process. Orbits:  Negative. No traumatic or inflammatory finding. Sinuses: Mucosal thickening of the ethmoid air cells, remaining paranasal sinuses and mastoid air cells are clear. Soft tissues: Negative. CT CERVICAL SPINE FINDINGS Alignment: Straightening of the cervical spine. Skull base and vertebrae: No acute fracture. No primary bone lesion or focal pathologic process. Soft tissues and spinal canal: No prevertebral fluid or swelling. No visible canal hematoma. 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 Associated uncovertebral joint and facet joint arthropathy. C2-C3: No significant disc bulge, spinal canal or neural foraminal stenosis. C3-C4: Disc height loss and uncovertebral joint arthropathy with mild left and moderate right neural  foraminal stenosis. Mild bilateral facet joint arthropathy. C4-C5: Disc height loss and uncovertebral joint arthropathy with mild left neural foraminal stenosis. Moderate left facet joint arthropathy. C5-C6: Near complete loss of the disc space with disc osteophyte fight complex with mild narrowing of the spinal canal. Mild-to-moderate bilateral neural foraminal stenosis. C6-C7: Disc height loss and disc osteophyte complex with mild spinal canal stenosis. Mild right neural foraminal stenosis. C7-T1: Disc height loss without significant spinal canal or neural foraminal stenosis. Upper chest: Negative. Other: None IMPRESSION: CT head: 1. No acute intracranial abnormality. 2. Mild cerebral atrophy, advanced chronic microvascular ischemic changes of the white matter. Chronic left occipital lobe infarct. CT maxillofacial: 1. No fracture or dislocation. 2. Orbits are unremarkable.  No significant soft tissue injury. CT cervical spine: 1. No acute fracture or traumatic subluxation. 2. 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 Multilevel facet joint and uncovertebral vertebral joint arthropathy. Electronically Signed   By: Keane Police D.O.   On: 10/12/2021 12:53   CT Maxillofacial Wo Contrast  Result Date: 10/12/2021 CLINICAL DATA:  Fall.   Head, face and neck trauma. EXAM: CT HEAD WITHOUT CONTRAST CT MAXILLOFACIAL WITHOUT CONTRAST CT CERVICAL SPINE WITHOUT CONTRAST TECHNIQUE: Multidetector CT imaging of the head, cervical spine, and maxillofacial structures were performed using the standard protocol without intravenous contrast. Multiplanar CT image reconstructions of the cervical spine and maxillofacial structures were also generated. RADIATION DOSE REDUCTION: This exam was performed according to the departmental dose-optimization program which includes automated exposure control, adjustment of the mA and/or kV according to patient size and/or use of  iterative reconstruction technique. COMPARISON:  None Available. FINDINGS: CT HEAD FINDINGS Brain: No evidence of acute infarction, hemorrhage, hydrocephalus, extra-axial collection or mass lesion/mass effect. Mild cerebral volume loss. Encephalomalacia of the left occipital lobe and advanced chronic microvascular ischemic changes of the periventricular and subcortical white matter, unchanged. Vascular: No hyperdense vessel or unexpected calcification. Skull: Normal. Negative for fracture or focal lesion. Other: None. CT MAXILLOFACIAL FINDINGS Osseous: No fracture or mandibular dislocation. No destructive process. Orbits: Negative. No traumatic or inflammatory finding. Sinuses: Mucosal thickening of the ethmoid air cells, remaining paranasal sinuses and mastoid air cells are clear. Soft tissues: Negative. CT CERVICAL SPINE FINDINGS Alignment: Straightening of the cervical spine. Skull base and vertebrae: No acute fracture. No primary bone lesion or focal pathologic process. Soft tissues and spinal canal: No prevertebral fluid or swelling. No visible canal hematoma. 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 Associated uncovertebral joint and facet joint arthropathy. C2-C3: No significant disc bulge, spinal canal or neural foraminal stenosis. C3-C4: Disc height loss and uncovertebral joint arthropathy with mild left and moderate right neural foraminal stenosis. Mild bilateral facet joint arthropathy. C4-C5: Disc height loss and  uncovertebral joint arthropathy with mild left neural foraminal stenosis. Moderate left facet joint arthropathy. C5-C6: Near complete loss of the disc space with disc osteophyte fight complex with mild narrowing of the spinal canal. Mild-to-moderate bilateral neural foraminal stenosis. C6-C7: Disc height loss and disc osteophyte complex with mild spinal canal stenosis. Mild right neural foraminal stenosis. C7-T1: Disc  height loss without significant spinal canal or neural foraminal stenosis. Upper chest: Negative. Other: None IMPRESSION: CT head: 1. No acute intracranial abnormality. 2. Mild cerebral atrophy, advanced chronic microvascular ischemic changes of the white matter. Chronic left occipital lobe infarct. CT maxillofacial: 1. No fracture or dislocation. 2. Orbits are unremarkable.  No significant soft tissue injury. CT cervical spine: 1. No acute fracture or traumatic subluxation. 2. 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 Multilevel facet joint and uncovertebral vertebral joint arthropathy. Electronically Signed   By: Keane Police D.O.   On: 10/12/2021 12:53   DG Foot Complete Left  Result Date: 10/12/2021 CLINICAL DATA:  Trauma. Patient became dizzy and fell from standing position today, pain in right knee and left foot. EXAM: LEFT FOOT - COMPLETE 3+ VIEW COMPARISON:  None Available. FINDINGS: Mildly displaced 2-5 LEFT metatarsal head fractures, with intra-articular extension at the 5th MTP joint. Normal appearance of Lisfranc joint. Degenerative changes within the LEFT foot, including plantar and calcaneal enthesophytes. IMPRESSION: Mildly displaced LEFT 2-5 metatarsal head fractures, with intra-articular extension at the 5th MTP joint. Electronically Signed   By: Michaelle Birks M.D.   On: 10/12/2021 12:28   DG Pelvis Portable  Result Date: 10/12/2021 CLINICAL DATA:  Trauma, dizziness, fell from standing position EXAM: PORTABLE PELVIS 1-2 VIEWS COMPARISON:  Portable exam 1156 hours without priors for comparison FINDINGS: Osseous mineralization normal. Superior pelvics excluded. Hip and SI joint spaces appear preserved. No fracture, dislocation or bone destruction. IMPRESSION: No acute osseous abnormalities. Electronically Signed   By: Lavonia Dana M.D.   On: 10/12/2021 12:26   DG Knee 2 Views Right  Result Date: 10/12/2021 CLINICAL DATA:  RIGHT  knee pain post fall EXAM: RIGHT KNEE - 1-2 VIEW COMPARISON:  RIGHT tibial and fibular radiographs 12/11/2019 FINDINGS: Mild medial compartment joint space narrowing. Osseous mineralization normal. Patellar enthesophytes. No acute fracture, dislocation, or bone destruction. No joint effusion. IMPRESSION: No acute osseous abnormalities. Electronically Signed   By: Lavonia Dana M.D.   On: 10/12/2021 12:24   DG Chest Port 1 View  Result Date: 10/12/2021 CLINICAL DATA:  Trauma EXAM: PORTABLE CHEST 1 VIEW COMPARISON:  Chest x-ray dated October 21, 2020 FINDINGS: New widening of the mediastinum, likely related to exam technique. Cardiac contours are unchanged. Chest wall dual lead pacer with unchanged lead position. Mild bibasilar opacities which are likely due to atelectasis. Lungs otherwise clear. No large pleural effusion or pneumothorax. IMPRESSION: 1. New widening of the mediastinum, likely related to exam technique. Finding could be further evaluated with PA and lateral chest x-ray. If there is concern for traumatic aortic injury, CTA chest could be performed. 2. Mild bibasilar opacities which are likely due to atelectasis. Lungs otherwise clear. Electronically Signed   By: Yetta Glassman M.D.   On: 10/12/2021 12:24   CT Angio Chest/Abd/Pel for Dissection W and/or Wo Contrast  Final Result    CT Head Wo Contrast  Final Result    CT Cervical Spine Wo Contrast  Final Result    CT Maxillofacial Wo Contrast  Final Result    DG Chest  Port 1 View  Final Result    DG Pelvis Portable  Final Result    DG Knee 2 Views Right  Final Result    DG Foot Complete Left  Final Result    CT FOOT LEFT WO CONTRAST    (Results Pending)    Consults called:  Orthopedic surgery  EKG:  Pending   Dwyane Dee, MD [facility] 10/12/2021, 6:48 PM

## 2021-10-12 NOTE — Assessment & Plan Note (Signed)
-   Continue finasteride 

## 2021-10-12 NOTE — Assessment & Plan Note (Addendum)
-   s/p fall; sounds mechanical but cannot rule out some orthostasis that may have contributed - per CT left foot: "Displaced fracture of the distal fibula just above the lateral malleolus. Displaced comminuted intra-articular fracture of the tibial plafond involving the medial and posterior malleoli. Displaced fracture of the 2nd-4th metatarsal head and neck junction. Questionable fracture of the fifth metatarsal head and neck junction. - NWB LLE per ortho recommendations - resume Eliquis today - PT/OT consults; suspect he may need SNF as he will not have any help at home (his wife is now inpatient hospice)

## 2021-10-12 NOTE — Consult Note (Signed)
Reason for Consult:Left MT fxs Referring Physician: Campbell Stall Time called: [id number] Time at bedside: Alfred Meyers is an 76 y.o. male.  HPI: [name] suffered what sounds like a syncopal event at home and fell. He was unable to bear weight on his left foot afterwards. He was brought to the ED as a level 2 trauma activation. Workup showed left MT fxs and orthopedic surgery was consulted. He denies pain at this point.  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  Social History:  reports that he has never smoked. He has never used smokeless tobacco. He reports that he does not drink alcohol and does not use drugs.  Allergies: No Known Allergies  Medications: I have reviewed the patient's current medications.  Results for orders placed or performed during the hospital encounter of 10/12/21 (from the past 48 hour(s))  Resp Panel by RT-PCR (Flu A&B, Covid) Anterior Nasal Swab     Status: None   Collection Time: 10/12/21 11:43 AM   Specimen: Anterior Nasal Swab  Result Value Ref Range   SARS Coronavirus 2 by RT PCR NEGATIVE NEGATIVE    Comment: (NOTE) SARS-CoV-2 target nucleic acids are NOT DETECTED.  The SARS-CoV-2 RNA is generally detectable in upper respiratory specimens during the acute phase of infection. The lowest concentration of SARS-CoV-2 viral copies this assay can detect is 138 copies/mL. A negative result does not preclude SARS-Cov-2 infection and should not be used as the sole basis for treatment  or other patient management decisions. A negative result may occur with  improper specimen collection/handling, submission of specimen other than nasopharyngeal swab, presence of viral mutation(s) within the areas targeted by this assay, and inadequate number of viral copies(<138 copies/mL). A negative result must be combined with clinical observations, patient history, and epidemiological information. The expected result is Negative.  Fact Sheet for Patients:  EntrepreneurPulse.com.au  Fact Sheet for Healthcare Providers:  IncredibleEmployment.be  This test is no t yet approved or cleared by the [place] FDA and  has been authorized for detection and/or diagnosis of SARS-CoV-2 by FDA under an Emergency Use Authorization (EUA). This EUA will remain  in effect (meaning this test can be used) for the duration of the COVID-19 declaration under Section 564(b)(1) of the Act, 21 U.S.C.section 360bbb-3(b)(1), unless the authorization is terminated  or revoked sooner.       Influenza A by PCR NEGATIVE NEGATIVE   Influenza B by PCR NEGATIVE NEGATIVE    Comment: (NOTE) The Xpert Xpress SARS-CoV-2/FLU/RSV plus assay is intended as an aid in the diagnosis of influenza from Nasopharyngeal swab specimens and should not be used as a sole basis for treatment. Nasal washings and aspirates are unacceptable for Xpert Xpress SARS-CoV-2/FLU/RSV testing.  Fact Sheet for Patients: EntrepreneurPulse.com.au  Fact Sheet for Healthcare Providers: IncredibleEmployment.be  This test is not yet approved or cleared by the [place] and has been authorized for  detection and/or diagnosis of SARS-CoV-2 by FDA under an Emergency Use Authorization (EUA). This EUA will remain in effect (meaning this test can be used) for the duration of the COVID-19 declaration under Section 564(b)(1) of the Act, 21 U.S.C. section  360bbb-3(b)(1), unless the authorization is terminated or revoked.  Performed at [facility], [place] [address] [place], [place] [address]   Comprehensive metabolic panel     Status: Abnormal   Collection Time: 10/12/21 11:45 AM  Result Value Ref Range   Sodium 137 135 - 145 mmol/L   Potassium 4.1 3.5 - 5.1 mmol/L   Chloride 101 98 - 111 mmol/L   CO2 24 22 - 32 mmol/L   Glucose, Bld 168 (H) 70 - 99 mg/dL    Comment: Glucose reference range applies only to samples taken after fasting for at least 8 hours.   BUN 29 (H) 8 - 23 mg/dL   Creatinine, Ser 2.13 (H) 0.61 - 1.24 mg/dL   Calcium 8.5 (L) 8.9 - 10.3 mg/dL   Total Protein 6.2 (L) 6.5 - 8.1 g/dL   Albumin 2.8 (L) 3.5 - 5.0 g/dL   AST 36 15 - 41 U/L   ALT 25 0 - 44 U/L   Alkaline Phosphatase 178 (H) 38 - 126 U/L   Total Bilirubin 0.9 0.3 - 1.2 mg/dL   GFR, Estimated 31 (L) >60 mL/min    Comment: (NOTE) Calculated using the CKD-EPI Creatinine Equation (2021)    Anion gap 12 5 - 15    Comment: Performed at [facility], [place] [address] [place], [place] [address]  CBC     Status: Abnormal   Collection Time: 10/12/21 11:45 AM  Result Value Ref Range   WBC 10.1 4.0 - 10.5 K/uL   RBC 3.81 (L) 4.22 - 5.81 MIL/uL   Hemoglobin 12.0 (L) 13.0 - 17.0 g/dL   HCT 36.2 (L) 39.0 - 52.0 %   MCV 95.0 80.0 - 100.0 fL   MCH 31.5 26.0 - 34.0 pg   MCHC 33.1 30.0 - 36.0 g/dL   RDW 15.6 (H) 11.5 - 15.5 %   Platelets 173 150 - 400 K/uL   nRBC 0.0 0.0 - 0.2 %    Comment: Performed at [facility], [place] [address], [place], [place] [address]  Ethanol     Status: None   Collection Time: 10/12/21 11:45 AM  Result Value Ref Range   Alcohol, Ethyl (B) <10 <10 mg/dL    Comment: (NOTE) Lowest detectable limit for serum alcohol is 10 mg/dL.  For medical purposes only. Performed at [facility], [place] [address] [place], [place] [address]   Lactic acid, plasma     Status: Abnormal   Collection Time: 10/12/21 11:45 AM   Result Value Ref Range   Lactic Acid, Venous 3.6 (HH) 0.5 - 1.9 mmol/L    Comment: CRITICAL RESULT CALLED TO, READ BACK BY AND VERIFIED WITH M.FRANCES,RN 1306 10/12/21 CLARK,S Performed at [facility], [place] [address], [place], [place] [address]   Protime-INR     Status: Abnormal   Collection Time: 10/12/21 11:45 AM  Result Value Ref Range   Prothrombin Time 19.4 (H) 11.4 - 15.2 seconds   INR 1.7 (H) 0.8 - 1.2    Comment: (NOTE) INR goal varies based on device and disease states. Performed at [facility], [place] [address] [place], [place] [address]   Sample to Blood Bank     Status:  None   Collection Time: 10/12/21 11:45 AM  Result Value Ref Range   Blood Bank Specimen SAMPLE AVAILABLE FOR TESTING    Sample Expiration      10/13/2021,2359 Performed at [facility], [place] [address] [place], [place] [address]   Troponin I (High Sensitivity)     Status: None   Collection Time: 10/12/21 11:45 AM  Result Value Ref Range   Troponin I (High Sensitivity) 13 <18 ng/L    Comment: (NOTE) Elevated high sensitivity troponin I (hsTnI) values and significant  changes across serial measurements may suggest ACS but many other  chronic and acute conditions are known to elevate hsTnI results.  Refer to the "Links" section for chest pain algorithms and additional  guidance. Performed at [facility], [place] [address], [place], [place] [address]   I-Stat Chem 8, ED     Status: Abnormal   Collection Time: 10/12/21 11:56 AM  Result Value Ref Range   Sodium 138 135 - 145 mmol/L   Potassium 4.1 3.5 - 5.1 mmol/L   Chloride 99 98 - 111 mmol/L   BUN 31 (H) 8 - 23 mg/dL   Creatinine, Ser 2.10 (H) 0.61 - 1.24 mg/dL   Glucose, Bld 163 (H) 70 - 99 mg/dL    Comment: Glucose reference range applies only to samples taken after fasting for at least 8 hours.   Calcium, Ion 1.05 (L) 1.15 - 1.40 mmol/L   TCO2 24 22 - 32 mmol/L   Hemoglobin 11.6 (L) 13.0 - 17.0 g/dL   HCT 34.0 (L)  39.0 - 52.0 %    CT Angio Chest/Abd/Pel for Dissection W and/or Wo Contrast  Result Date: 10/12/2021 CLINICAL DATA:  Syncope.  Widened mediastinum on chest x-ray. EXAM: CT ANGIOGRAPHY CHEST, ABDOMEN AND PELVIS TECHNIQUE: Non-contrast CT of the chest was initially obtained. Multidetector CT imaging through the chest, abdomen and pelvis was performed using the standard protocol during bolus administration of intravenous contrast. Multiplanar reconstructed images and MIPs were obtained and reviewed to evaluate the vascular anatomy. RADIATION DOSE REDUCTION: This exam was performed according to the departmental dose-optimization program which includes automated exposure control, adjustment of the mA and/or kV according to patient size and/or use of iterative reconstruction technique. CONTRAST:  29m OMNIPAQUE IOHEXOL 350 MG/ML SOLN COMPARISON:  None Available. FINDINGS: CTA CHEST FINDINGS Cardiovascular: Preferential opacification of the thoracic aorta. No evidence of thoracic aortic aneurysm or dissection. The heart is mildly enlarged. There is no pericardial effusion. There are atherosclerotic calcifications of the aorta and coronary arteries. Left-sided pacemaker is present with leads ending in the right atrium and right ventricle. There is no large central pulmonary embolism. Mediastinum/Nodes: No enlarged mediastinal, hilar, or axillary lymph nodes. Thyroid gland, trachea, and esophagus demonstrate no significant findings. Lungs/Pleura: There are minimal patchy airspace and ground-glass opacities in the bilateral lower lobes, left greater than right. There is no pleural effusion or pneumothorax. Trachea and central airways are patent. 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 No acute fractures are seen. Review of the MIP images confirms the above findings. CTA ABDOMEN AND PELVIS FINDINGS VASCULAR Aorta: Normal caliber aorta without aneurysm, dissection, vasculitis or significant stenosis. There  are atherosclerotic calcifications of the aorta. Celiac: Patent without evidence of aneurysm, dissection, vasculitis or significant stenosis. SMA: Patent without evidence of aneurysm, dissection, vasculitis or significant stenosis. Renals: Right renal artery is patent without evidence of aneurysm, dissection, vasculitis, fibromuscular dysplasia or significant stenosis. Patient is status post left nephrectomy. The  left renal artery is absent. IMA: Patent without evidence of aneurysm, dissection, vasculitis or significant stenosis. Inflow: Patent without evidence of aneurysm, dissection, vasculitis or significant stenosis. Veins: No obvious venous abnormality within the limitations of this arterial phase study. Review of the MIP images confirms the above findings. NON-VASCULAR Hepatobiliary: There is questionable nodular liver contour. No focal liver lesions are seen. Gallstones are present. There is no biliary ductal dilatation. Pancreas: There is a 16 mm rounded hypodense area in the tail the pancreas image 5/146. There is a 2 cm rounded hypodense area in the uncinate process of the pancreas image 5/175. No pancreatic ductal dilatation or surrounding inflammatory changes. Spleen: Normal in size without focal abnormality. 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 Right kidney, right adrenal gland and bladder appear within normal limits. Stomach/Bowel: Stomach is within normal limits. Appendix appears normal. No evidence of bowel wall thickening, distention, or inflammatory changes. Colonic diverticula are present. The appendix is not seen. Lymphatic: No enlarged lymph nodes. Reproductive: Prostate is unremarkable. Other: No ascites.  Small fat containing umbilical hernia. Musculoskeletal: No acute fractures. There is a single rounded sclerotic density in the L4 vertebral body measuring 9 mm. Degenerative changes affect the spine and hips. Review of the MIP images confirms the above  findings. IMPRESSION: 1. No evidence for aortic dissection or aneurysm. 2. Minimal patchy airspace and ground-glass opacities in the lung bases may represent atelectasis, mild edema or infection. 3. Cardiomegaly. 4. Questionable nodular liver contour. Correlate clinically for hepatocellular disease. 5. Cholelithiasis. 6. There are 2 hypodense lesions in the pancreas, possibly cystic. Recommend further evaluation with pancreatic MRI. 7. Left nephrectomy and adrenalectomy. 8. Single small sclerotic density in the L4 vertebral body, indeterminate. Correlate clinically for metastatic disease. Aortic Atherosclerosis (ICD10-I70.0). Electronically Signed   By: Ronney Asters M.D.   On: 10/12/2021 15:21   CT Head Wo Contrast  Result Date: 10/12/2021 CLINICAL DATA:  Fall.   Head, face and neck trauma. EXAM: CT HEAD WITHOUT CONTRAST CT MAXILLOFACIAL WITHOUT CONTRAST CT CERVICAL SPINE WITHOUT CONTRAST TECHNIQUE: Multidetector CT imaging of the head, cervical spine, and maxillofacial structures were performed using the standard protocol without intravenous contrast. Multiplanar CT image reconstructions of the cervical spine and maxillofacial structures were also generated. RADIATION DOSE REDUCTION: This exam was performed according to the departmental dose-optimization program which includes automated exposure control, adjustment of the mA and/or kV according to patient size and/or use of iterative reconstruction technique. COMPARISON:  None Available. FINDINGS: CT HEAD FINDINGS Brain: No evidence of acute infarction, hemorrhage, hydrocephalus, extra-axial collection or mass lesion/mass effect. Mild cerebral volume loss. Encephalomalacia of the left occipital lobe and advanced chronic microvascular ischemic changes of the periventricular and subcortical white matter, unchanged. Vascular: No hyperdense vessel or unexpected calcification. Skull: Normal. Negative for fracture or focal lesion. Other: None. CT MAXILLOFACIAL  FINDINGS Osseous: No fracture or mandibular dislocation. No destructive process. Orbits: Negative. No traumatic or inflammatory finding. Sinuses: Mucosal thickening of the ethmoid air cells, remaining paranasal sinuses and mastoid air cells are clear. Soft tissues: Negative. CT CERVICAL SPINE FINDINGS Alignment: Straightening of the cervical spine. Skull base and vertebrae: No acute fracture. No primary bone lesion or focal pathologic process. Soft tissues and spinal canal: No prevertebral fluid or swelling. No visible canal hematoma. 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 Associated uncovertebral joint and facet joint arthropathy. C2-C3: No significant disc bulge, spinal canal or neural foraminal stenosis. C3-C4:  Disc height loss and uncovertebral joint arthropathy with mild left and moderate right neural foraminal stenosis. Mild bilateral facet joint arthropathy. C4-C5: Disc height loss and uncovertebral joint arthropathy with mild left neural foraminal stenosis. Moderate left facet joint arthropathy. C5-C6: Near complete loss of the disc space with disc osteophyte fight complex with mild narrowing of the spinal canal. Mild-to-moderate bilateral neural foraminal stenosis. C6-C7: Disc height loss and disc osteophyte complex with mild spinal canal stenosis. Mild right neural foraminal stenosis. C7-T1: Disc height loss without significant spinal canal or neural foraminal stenosis. Upper chest: Negative. Other: None IMPRESSION: CT head: 1. No acute intracranial abnormality. 2. Mild cerebral atrophy, advanced chronic microvascular ischemic changes of the white matter. Chronic left occipital lobe infarct. CT maxillofacial: 1. No fracture or dislocation. 2. Orbits are unremarkable.  No significant soft tissue injury. CT cervical spine: 1. No acute fracture or traumatic subluxation. 2. Advanced multilevel degenerative disease with near complete  loss of disc space at C5-C6, C6-C7 and C7-T1. Multilevel facet joint and uncovertebral vertebral joint arthropathy. Electronically Signed   By: Keane Police D.O.   On: 10/12/2021 12:53   CT Cervical Spine Wo Contrast  Result Date: 10/12/2021 CLINICAL DATA:  Fall.   Head, face and neck trauma. EXAM: CT HEAD WITHOUT CONTRAST CT MAXILLOFACIAL WITHOUT CONTRAST CT CERVICAL SPINE WITHOUT CONTRAST TECHNIQUE: Multidetector CT imaging of the head, cervical spine, and maxillofacial structures were performed using the standard protocol without intravenous contrast. Multiplanar CT image reconstructions of the cervical spine and maxillofacial structures were also generated. RADIATION DOSE REDUCTION: This exam was performed according to the departmental dose-optimization program which includes automated exposure control, adjustment of the mA and/or kV according to patient size and/or use of iterative reconstruction technique. COMPARISON:  None Available. FINDINGS: CT HEAD FINDINGS Brain: No evidence of acute infarction, hemorrhage, hydrocephalus, extra-axial collection or mass lesion/mass effect. Mild cerebral volume loss. Encephalomalacia of the left occipital lobe and advanced chronic microvascular ischemic changes of the periventricular and subcortical white matter, unchanged. Vascular: No hyperdense vessel or unexpected calcification. Skull: Normal. Negative for fracture or focal lesion. Other: None. CT MAXILLOFACIAL FINDINGS Osseous: No fracture or mandibular dislocation. No destructive process. Orbits: Negative. No traumatic or inflammatory finding. Sinuses: Mucosal thickening of the ethmoid air cells, remaining paranasal sinuses and mastoid air cells are clear. Soft tissues: Negative. CT CERVICAL SPINE FINDINGS Alignment: Straightening of the cervical spine. Skull base and vertebrae: No acute fracture. No primary bone lesion or focal pathologic process. Soft tissues and spinal canal: No prevertebral fluid or swelling.  No visible canal hematoma. 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 Associated uncovertebral joint and facet joint arthropathy. C2-C3: No significant disc bulge, spinal canal or neural foraminal stenosis. C3-C4: Disc height loss and uncovertebral joint arthropathy with mild left and moderate right neural foraminal stenosis. Mild bilateral facet joint arthropathy. C4-C5: Disc height loss and uncovertebral joint arthropathy with mild left neural foraminal stenosis. Moderate left facet joint arthropathy. C5-C6: Near complete loss of the disc space with disc osteophyte fight complex with mild narrowing of the spinal canal. Mild-to-moderate bilateral neural foraminal stenosis. C6-C7: Disc height loss and disc osteophyte complex with mild spinal canal stenosis. Mild right neural foraminal stenosis. C7-T1: Disc height loss without significant spinal canal or neural foraminal stenosis. Upper chest: Negative. Other: None IMPRESSION: CT head: 1. No acute intracranial abnormality. 2. Mild cerebral atrophy, advanced chronic microvascular ischemic changes of the white matter. Chronic left occipital lobe  infarct. CT maxillofacial: 1. No fracture or dislocation. 2. Orbits are unremarkable.  No significant soft tissue injury. CT cervical spine: 1. No acute fracture or traumatic subluxation. 2. 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 Multilevel facet joint and uncovertebral vertebral joint arthropathy. Electronically Signed   By: Keane Police D.O.   On: 10/12/2021 12:53   CT Maxillofacial Wo Contrast  Result Date: 10/12/2021 CLINICAL DATA:  Fall.   Head, face and neck trauma. EXAM: CT HEAD WITHOUT CONTRAST CT MAXILLOFACIAL WITHOUT CONTRAST CT CERVICAL SPINE WITHOUT CONTRAST TECHNIQUE: Multidetector CT imaging of the head, cervical spine, and maxillofacial structures were performed using the  standard protocol without intravenous contrast. Multiplanar CT image reconstructions of the cervical spine and maxillofacial structures were also generated. RADIATION DOSE REDUCTION: This exam was performed according to the departmental dose-optimization program which includes automated exposure control, adjustment of the mA and/or kV according to patient size and/or use of iterative reconstruction technique. COMPARISON:  None Available. FINDINGS: CT HEAD FINDINGS Brain: No evidence of acute infarction, hemorrhage, hydrocephalus, extra-axial collection or mass lesion/mass effect. Mild cerebral volume loss. Encephalomalacia of the left occipital lobe and advanced chronic microvascular ischemic changes of the periventricular and subcortical white matter, unchanged. Vascular: No hyperdense vessel or unexpected calcification. Skull: Normal. Negative for fracture or focal lesion. Other: None. CT MAXILLOFACIAL FINDINGS Osseous: No fracture or mandibular dislocation. No destructive process. Orbits: Negative. No traumatic or inflammatory finding. Sinuses: Mucosal thickening of the ethmoid air cells, remaining paranasal sinuses and mastoid air cells are clear. Soft tissues: Negative. CT CERVICAL SPINE FINDINGS Alignment: Straightening of the cervical spine. Skull base and vertebrae: No acute fracture. No primary bone lesion or focal pathologic process. Soft tissues and spinal canal: No prevertebral fluid or swelling. No visible canal hematoma. 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 Associated uncovertebral joint and facet joint arthropathy. C2-C3: No significant disc bulge, spinal canal or neural foraminal stenosis. C3-C4: Disc height loss and uncovertebral joint arthropathy with mild left and moderate right neural foraminal stenosis. Mild bilateral facet joint arthropathy. C4-C5: Disc height loss and uncovertebral joint arthropathy with mild left  neural foraminal stenosis. Moderate left facet joint arthropathy. C5-C6: Near complete loss of the disc space with disc osteophyte fight complex with mild narrowing of the spinal canal. Mild-to-moderate bilateral neural foraminal stenosis. C6-C7: Disc height loss and disc osteophyte complex with mild spinal canal stenosis. Mild right neural foraminal stenosis. C7-T1: Disc height loss without significant spinal canal or neural foraminal stenosis. Upper chest: Negative. Other: None IMPRESSION: CT head: 1. No acute intracranial abnormality. 2. Mild cerebral atrophy, advanced chronic microvascular ischemic changes of the white matter. Chronic left occipital lobe infarct. CT maxillofacial: 1. No fracture or dislocation. 2. Orbits are unremarkable.  No significant soft tissue injury. CT cervical spine: 1. No acute fracture or traumatic subluxation. 2. 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 Multilevel facet joint and uncovertebral vertebral joint arthropathy. Electronically Signed   By: Keane Police D.O.   On: 10/12/2021 12:53   DG Foot Complete Left  Result Date: 10/12/2021 CLINICAL DATA:  Trauma. Patient became dizzy and fell from standing position today, pain in right knee and left foot. EXAM: LEFT FOOT - COMPLETE 3+ VIEW COMPARISON:  None Available. FINDINGS: Mildly displaced 2-5 LEFT metatarsal head fractures, with intra-articular extension at the 5th MTP joint. Normal appearance  of Lisfranc joint. Degenerative changes within the LEFT foot, including plantar and calcaneal enthesophytes. IMPRESSION: Mildly displaced LEFT 2-5 metatarsal head fractures, with intra-articular extension at the 5th MTP joint. Electronically Signed   By: Michaelle Birks M.D.   On: 10/12/2021 12:28   DG Pelvis Portable  Result Date: 10/12/2021 CLINICAL DATA:  Trauma, dizziness, fell from standing position EXAM: PORTABLE PELVIS 1-2 VIEWS COMPARISON:  Portable exam 1156  hours without priors for comparison FINDINGS: Osseous mineralization normal. Superior pelvics excluded. Hip and SI joint spaces appear preserved. No fracture, dislocation or bone destruction. IMPRESSION: No acute osseous abnormalities. Electronically Signed   By: Lavonia Dana M.D.   On: 10/12/2021 12:26   DG Knee 2 Views Right  Result Date: 10/12/2021 CLINICAL DATA:  RIGHT knee pain post fall EXAM: RIGHT KNEE - 1-2 VIEW COMPARISON:  RIGHT tibial and fibular radiographs 12/11/2019 FINDINGS: Mild medial compartment joint space narrowing. Osseous mineralization normal. Patellar enthesophytes. No acute fracture, dislocation, or bone destruction. No joint effusion. IMPRESSION: No acute osseous abnormalities. Electronically Signed   By: Lavonia Dana M.D.   On: 10/12/2021 12:24   DG Chest Port 1 View  Result Date: 10/12/2021 CLINICAL DATA:  Trauma EXAM: PORTABLE CHEST 1 VIEW COMPARISON:  Chest x-ray dated October 21, 2020 FINDINGS: New widening of the mediastinum, likely related to exam technique. Cardiac contours are unchanged. Chest wall dual lead pacer with unchanged lead position. Mild bibasilar opacities which are likely due to atelectasis. Lungs otherwise clear. No large pleural effusion or pneumothorax. IMPRESSION: 1. New widening of the mediastinum, likely related to exam technique. Finding could be further evaluated with PA and lateral chest x-ray. If there is concern for traumatic aortic injury, CTA chest could be performed. 2. Mild bibasilar opacities which are likely due to atelectasis. Lungs otherwise clear. Electronically Signed   By: Yetta Glassman M.D.   On: 10/12/2021 12:24    Review of Systems  HENT:  Negative for ear discharge, ear pain, hearing loss and tinnitus.   Eyes:  Negative for photophobia and pain.  Respiratory:  Negative for cough and shortness of breath.   Cardiovascular:  Negative for chest pain.  Gastrointestinal:  Negative for abdominal pain, nausea and vomiting.   Genitourinary:  Negative for dysuria, flank pain, frequency and urgency.  Musculoskeletal:  Negative for arthralgias, back pain, myalgias and neck pain.  Neurological:  Negative for dizziness and headaches.  Hematological:  Does not bruise/bleed easily.  Psychiatric/Behavioral:  The patient is not nervous/anxious.    Blood pressure 118/70, pulse 73, temperature 97.7 F (36.5 C), temperature source Oral, resp. rate 19, height '5\' 9"'$  (1.753 m), weight 128 kg, SpO2 98 %. Physical Exam Constitutional:      General: He is not in acute distress.    Appearance: He is well-developed. He is not diaphoretic.  HENT:     Head: Normocephalic and atraumatic.  Eyes:     General: No scleral icterus.       Right eye: No discharge.        Left eye: No discharge.     Conjunctiva/sclera: Conjunctivae normal.  Cardiovascular:     Rate and Rhythm: Normal rate and regular rhythm.  Pulmonary:     Effort: Pulmonary effort is normal. No respiratory distress.  Musculoskeletal:     Cervical back: Normal range of motion.     Comments: LLE No traumatic wounds, ecchymosis, or rash  Nontender  No knee or ankle effusion  Knee stable to varus/ valgus and anterior/posterior stress  Sens DPN, SPN, TN severely paresthetic (chronic)  Motor EHL, ext, flex, evers 5/5  DP 1+, PT 0, 2+ edema  Skin:    General: Skin is warm and dry.  Neurological:     Mental Status: He is alert.  Psychiatric:        Mood and Affect: Mood normal.        Behavior: Behavior normal.     Assessment/Plan: Left MT fxs -- Can likely treat non-operatively. Will get CT to better characterize fxs.    Lisette Abu, PA-C Orthopedic Surgery [phone] 10/12/2021, 4:05 PM

## 2021-10-12 NOTE — Assessment & Plan Note (Addendum)
-   Presented hypotensive and responded to fluids - continue eliquis and Toprol (if able)

## 2021-10-12 NOTE — ED Provider Notes (Signed)
[facility] EMERGENCY DEPARTMENT Provider Note   CSN: [id number] Arrival date & time: 10/12/21  1134     History  Chief Complaint  Patient presents with   Alfred Meyers is a 76 y.o. male.  Patient is a 76 year old male diabetes, hypertension, A-fib, and CKD stage III presenting for complaints of fall.  States he went to stand and felt lightheaded and dizzy and fell forward hitting his face on the ground.  Denies loss of consciousness.  Currently on warfarin.  Evidence of facial trauma with bruising over the nasal bridge and facial face lacerations.  Complaining of right-sided knee pain and left-sided foot pain.  Blood pressure hypotensive in the 90s on arrival.  Patient states he was otherwise feeling well this morning without any fevers, chills, nausea, vomiting, abdominal pain, dysuria, or signs of illness.  Denies chest pain or shortness of breath.  The history is provided by the patient. No language interpreter was used.       Home Medications Prior to Admission medications   Medication Sig Start Date End Date Taking? Authorizing Provider  acetaminophen (TYLENOL) 325 MG tablet Take 2 tablets (650 mg total) by mouth every 6 (six) hours as needed for up to 30 doses for mild pain or moderate pain. 06/26/20  Yes Trifan, Carola Rhine, MD  apixaban (ELIQUIS) 5 MG TABS tablet Take 5 mg by mouth 2 (two) times daily.   Yes [provider]  atorvastatin (LIPITOR) 80 MG tablet Take 80 mg by mouth at bedtime.   Yes [provider]  cetirizine (ZYRTEC) 10 MG tablet Take 10 mg by mouth at bedtime.   Yes [provider]  cholecalciferol (VITAMIN D3) 25 MCG (1000 UNIT) tablet Take 1,000 Units by mouth daily.   Yes [provider]  citalopram (CELEXA) 20 MG tablet Take 10 mg by mouth daily.    Yes [provider]  finasteride (PROSCAR) 5 MG tablet Take 5 mg by mouth daily.   Yes [provider]  gabapentin (NEURONTIN)  400 MG capsule Take 400 mg by mouth 2 (two) times daily.   Yes [provider]  hydrOXYzine (VISTARIL) 100 MG capsule Take 100 mg by mouth as needed for itching or anxiety.   Yes [provider]  insulin aspart protamine - aspart (NOVOLOG 70/30 MIX) (70-30) 100 UNIT/ML FlexPen Inject 70 Units into the skin in the morning and at bedtime.   Yes [provider]  levothyroxine (SYNTHROID) 100 MCG tablet Take 100 mcg by mouth 2 (two) times daily.   Yes [provider]  losartan (COZAAR) 100 MG tablet Take 25 mg by mouth at bedtime.    Yes [provider]  memantine (NAMENDA) 10 MG tablet Take 10 mg by mouth 2 (two) times daily.   Yes [provider]  metoprolol (TOPROL-XL) 200 MG 24 hr tablet Take 100 mg by mouth daily.    Yes [provider]  oxybutynin (DITROPAN) 5 MG tablet Take 5 mg by mouth daily.   Yes [provider]  potassium chloride SA (KLOR-CON) 20 MEQ tablet Take 40 mEq by mouth daily.   Yes [provider]  terazosin (HYTRIN) 5 MG capsule Take 5 mg by mouth at bedtime.   Yes [provider]  torsemide (DEMADEX) 20 MG tablet Take 40 mg by mouth 2 (two) times daily.   Yes [provider]  traMADol (ULTRAM) 50 MG tablet Take 50 mg by mouth daily.  Yes [provider]  allopurinol (ZYLOPRIM) 100 MG tablet Take 100 mg by mouth 2 (two) times daily.  Patient not taking: Reported on 10/12/2021    [provider]  amiodarone (PACERONE) 200 MG tablet Take 200 mg by mouth at bedtime. Patient not taking: Reported on 10/12/2021    [provider]  ARTIFICIAL SALIVA MT Use as directed 4 sprays in the mouth or throat every hour as needed (dry mouth). Patient not taking: Reported on 10/12/2021    [provider]  cadexomer iodine (IODOSORB) 0.9 % gel Apply 1 application topically See admin instructions. Apply a small amount to affected area daily to left foot wound after  cleansing foot with carra klens wound cleanser Patient not taking: Reported on 10/12/2021    [provider]  cyclobenzaprine (FLEXERIL) 10 MG tablet Take 1 tablet (10 mg total) by mouth at bedtime as needed for up to 10 doses for muscle spasms. Patient not taking: Reported on 10/12/2021 06/26/20   Wyvonnia Dusky, MD  lidocaine (LIDODERM) 5 % Place 1 patch onto the skin daily. Remove & Discard patch within 12 hours or as directed by MD Patient not taking: Reported on 10/12/2021    [provider]  liraglutide (VICTOZA) 18 MG/3ML SOPN Inject 1 mg into the skin once a week. Verified ('1mg'$ ) weekly. Patient not taking: Reported on 10/12/2021    [provider]  naloxone [facility]) nasal spray 4 mg/0.1 mL Place 1 spray into the nose once. For opioid overdose. Patient not taking: Reported on 10/12/2021    [provider]  Propylene Glycol 0.6 % SOLN Place 1 drop into both eyes in the morning, at noon, and at bedtime. Patient not taking: Reported on 10/12/2021    [provider]  triamcinolone cream (KENALOG) 0.1 % Apply 1 application topically 2 (two) times daily. Patient not taking: Reported on 10/12/2021    [provider]  warfarin (COUMADIN) 5 MG tablet Take 5-7.5 mg by mouth See admin instructions. '5mg'$  five (5) times per week on Tues, Wed, Thurs, Sat, Sun. 7.'5mg'$  two (2) times per week on Mon and Friday. Follow up at the [facility] in [place] Patient not taking: Reported on 10/12/2021    [provider]      Allergies    Patient has no known allergies.    Review of Systems   Review of Systems  Constitutional:  Negative for chills and fever.  HENT:  Negative for ear pain and sore throat.   Eyes:  Negative for pain and visual disturbance.  Respiratory:  Negative for cough and shortness of breath.   Cardiovascular:  Negative for chest pain and palpitations.  Gastrointestinal:  Negative for abdominal pain and vomiting.  Genitourinary:   Negative for dysuria and hematuria.  Musculoskeletal:  Negative for arthralgias and back pain.  Skin:  Negative for color change and rash.  Neurological:  Negative for seizures and syncope.  All other systems reviewed and are negative.   Physical Exam Updated Vital Signs BP (!) 111/57 (BP Location: Right Arm)   Pulse 70   Temp 97.9 F (36.6 C) (Oral)   Resp 20   Ht '5\' 9"'$  (1.753 m)   Wt 128 kg   SpO2 97%   BMI 41.67 kg/m  Physical Exam Vitals and nursing note reviewed.  Constitutional:      General: He is not in acute distress.    Appearance: He is well-developed.  HENT:     Head: Normocephalic.  Nose:   Eyes:     Conjunctiva/sclera: Conjunctivae normal.  Cardiovascular:     Rate and Rhythm: Normal rate and regular rhythm.     Heart sounds: No murmur heard. Pulmonary:     Effort: Pulmonary effort is normal. No respiratory distress.     Breath sounds: Normal breath sounds.  Abdominal:     Palpations: Abdomen is soft.     Tenderness: There is no abdominal tenderness.  Musculoskeletal:        General: No swelling.     Cervical back: Neck supple.  Skin:    General: Skin is warm and dry.     Capillary Refill: Capillary refill takes less than 2 seconds.  Neurological:     Mental Status: He is alert.  Psychiatric:        Mood and Affect: Mood normal.     ED Results / Procedures / Treatments   Labs (all labs ordered are listed, but only abnormal results are displayed) Labs Reviewed  COMPREHENSIVE METABOLIC PANEL - Abnormal; Notable for the following components:      Result Value   Glucose, Bld 168 (*)    BUN 29 (*)    Creatinine, Ser 2.13 (*)    Calcium 8.5 (*)    Total Protein 6.2 (*)    Albumin 2.8 (*)    Alkaline Phosphatase 178 (*)    GFR, Estimated 31 (*)    All other components within normal limits  CBC - Abnormal; Notable for the following components:   RBC 3.81 (*)    Hemoglobin 12.0 (*)    HCT 36.2 (*)    RDW 15.6 (*)    All other components  within normal limits  URINALYSIS, ROUTINE W REFLEX MICROSCOPIC - Abnormal; Notable for the following components:   Color, Urine STRAW (*)    All other components within normal limits  LACTIC ACID, PLASMA - Abnormal; Notable for the following components:   Lactic Acid, Venous 3.6 (*)    All other components within normal limits  PROTIME-INR - Abnormal; Notable for the following components:   Prothrombin Time 19.4 (*)    INR 1.7 (*)    All other components within normal limits  HEMOGLOBIN A1C - Abnormal; Notable for the following components:   Hgb A1c MFr Bld 9.2 (*)    All other components within normal limits  LACTIC ACID, PLASMA - Abnormal; Notable for the following components:   Lactic Acid, Venous 2.4 (*)    All other components within normal limits  BASIC METABOLIC PANEL - Abnormal; Notable for the following components:   Glucose, Bld 199 (*)    BUN 28 (*)    Creatinine, Ser 1.96 (*)    Calcium 8.4 (*)    GFR, Estimated 34 (*)    All other components within normal limits  CBC WITH DIFFERENTIAL/PLATELET - Abnormal; Notable for the following components:   RBC 3.85 (*)    Hemoglobin 12.2 (*)    HCT 35.7 (*)    All other components within normal limits  BASIC METABOLIC PANEL - Abnormal; Notable for the following components:   Sodium 134 (*)    Glucose, Bld 283 (*)    Creatinine, Ser 1.66 (*)    Calcium 8.6 (*)    GFR, Estimated 42 (*)    All other components within normal limits  CBC WITH DIFFERENTIAL/PLATELET - Abnormal; Notable for the following components:   WBC 13.9 (*)    RBC 4.05 (*)    Hemoglobin 12.6 (*)  HCT 36.5 (*)    RDW 15.8 (*)    Neutro Abs 12.1 (*)    All other components within normal limits  GLUCOSE, CAPILLARY - Abnormal; Notable for the following components:   Glucose-Capillary 247 (*)    All other components within normal limits  GLUCOSE, CAPILLARY - Abnormal; Notable for the following components:   Glucose-Capillary 276 (*)    All other  components within normal limits  GLUCOSE, CAPILLARY - Abnormal; Notable for the following components:   Glucose-Capillary 293 (*)    All other components within normal limits  GLUCOSE, CAPILLARY - Abnormal; Notable for the following components:   Glucose-Capillary 276 (*)    All other components within normal limits  GLUCOSE, CAPILLARY - Abnormal; Notable for the following components:   Glucose-Capillary 251 (*)    All other components within normal limits  BASIC METABOLIC PANEL - Abnormal; Notable for the following components:   Glucose, Bld 226 (*)    Creatinine, Ser 1.64 (*)    GFR, Estimated 43 (*)    All other components within normal limits  CBC WITH DIFFERENTIAL/PLATELET - Abnormal; Notable for the following components:   WBC 12.7 (*)    RBC 4.02 (*)    Hemoglobin 12.4 (*)    HCT 36.4 (*)    RDW 15.9 (*)    Neutro Abs 10.3 (*)    Abs Immature Granulocytes 0.08 (*)    All other components within normal limits  GLUCOSE, CAPILLARY - Abnormal; Notable for the following components:   Glucose-Capillary 185 (*)    All other components within normal limits  GLUCOSE, CAPILLARY - Abnormal; Notable for the following components:   Glucose-Capillary 231 (*)    All other components within normal limits  GLUCOSE, CAPILLARY - Abnormal; Notable for the following components:   Glucose-Capillary 230 (*)    All other components within normal limits  GLUCOSE, CAPILLARY - Abnormal; Notable for the following components:   Glucose-Capillary 232 (*)    All other components within normal limits  BASIC METABOLIC PANEL - Abnormal; Notable for the following components:   Potassium 3.1 (*)    Glucose, Bld 176 (*)    Creatinine, Ser 1.45 (*)    Calcium 8.6 (*)    GFR, Estimated 49 (*)    All other components within normal limits  CBC WITH DIFFERENTIAL/PLATELET - Abnormal; Notable for the following components:   WBC 14.6 (*)    RBC 4.11 (*)    Hemoglobin 12.7 (*)    HCT 37.4 (*)    RDW 15.9  (*)    Neutro Abs 11.9 (*)    All other components within normal limits  GLUCOSE, CAPILLARY - Abnormal; Notable for the following components:   Glucose-Capillary 204 (*)    All other components within normal limits  GLUCOSE, CAPILLARY - Abnormal; Notable for the following components:   Glucose-Capillary 127 (*)    All other components within normal limits  GLUCOSE, CAPILLARY - Abnormal; Notable for the following components:   Glucose-Capillary 166 (*)    All other components within normal limits  GLUCOSE, CAPILLARY - Abnormal; Notable for the following components:   Glucose-Capillary 140 (*)    All other components within normal limits  GLUCOSE, CAPILLARY - Abnormal; Notable for the following components:   Glucose-Capillary 127 (*)    All other components within normal limits  BASIC METABOLIC PANEL - Abnormal; Notable for the following components:   Potassium 3.4 (*)    Glucose, Bld 132 (*)  Creatinine, Ser 1.46 (*)    Calcium 8.3 (*)    GFR, Estimated 49 (*)    All other components within normal limits  CBC WITH DIFFERENTIAL/PLATELET - Abnormal; Notable for the following components:   WBC 14.6 (*)    RBC 4.03 (*)    Hemoglobin 12.5 (*)    HCT 37.9 (*)    RDW 16.2 (*)    Neutro Abs 11.6 (*)    Abs Immature Granulocytes 0.10 (*)    All other components within normal limits  GLUCOSE, CAPILLARY - Abnormal; Notable for the following components:   Glucose-Capillary 156 (*)    All other components within normal limits  GLUCOSE, CAPILLARY - Abnormal; Notable for the following components:   Glucose-Capillary 119 (*)    All other components within normal limits  GLUCOSE, CAPILLARY - Abnormal; Notable for the following components:   Glucose-Capillary 123 (*)    All other components within normal limits  GLUCOSE, CAPILLARY - Abnormal; Notable for the following components:   Glucose-Capillary 116 (*)    All other components within normal limits  GLUCOSE, CAPILLARY - Abnormal;  Notable for the following components:   Glucose-Capillary 148 (*)    All other components within normal limits  GLUCOSE, CAPILLARY - Abnormal; Notable for the following components:   Glucose-Capillary 121 (*)    All other components within normal limits  BASIC METABOLIC PANEL - Abnormal; Notable for the following components:   Potassium 2.9 (*)    Glucose, Bld 120 (*)    Creatinine, Ser 1.29 (*)    Calcium 7.7 (*)    GFR, Estimated 57 (*)    All other components within normal limits  CBC WITH DIFFERENTIAL/PLATELET - Abnormal; Notable for the following components:   WBC 12.7 (*)    RBC 3.73 (*)    Hemoglobin 11.6 (*)    HCT 34.2 (*)    RDW 16.1 (*)    Neutro Abs 10.2 (*)    Abs Immature Granulocytes 0.08 (*)    All other components within normal limits  GLUCOSE, CAPILLARY - Abnormal; Notable for the following components:   Glucose-Capillary 154 (*)    All other components within normal limits  GLUCOSE, CAPILLARY - Abnormal; Notable for the following components:   Glucose-Capillary 54 (*)    All other components within normal limits  GLUCOSE, CAPILLARY - Abnormal; Notable for the following components:   Glucose-Capillary 134 (*)    All other components within normal limits  GLUCOSE, CAPILLARY - Abnormal; Notable for the following components:   Glucose-Capillary 191 (*)    All other components within normal limits  GLUCOSE, CAPILLARY - Abnormal; Notable for the following components:   Glucose-Capillary 137 (*)    All other components within normal limits  GLUCOSE, CAPILLARY - Abnormal; Notable for the following components:   Glucose-Capillary 150 (*)    All other components within normal limits  I-STAT CHEM 8, ED - Abnormal; Notable for the following components:   BUN 31 (*)    Creatinine, Ser 2.10 (*)    Glucose, Bld 163 (*)    Calcium, Ion 1.05 (*)    Hemoglobin 11.6 (*)    HCT 34.0 (*)    All other components within normal limits  CBG MONITORING, ED - Abnormal; Notable  for the following components:   Glucose-Capillary 150 (*)    All other components within normal limits  CBG MONITORING, ED - Abnormal; Notable for the following components:   Glucose-Capillary 203 (*)    All  other components within normal limits  CBG MONITORING, ED - Abnormal; Notable for the following components:   Glucose-Capillary 252 (*)    All other components within normal limits  CBG MONITORING, ED - Abnormal; Notable for the following components:   Glucose-Capillary 245 (*)    All other components within normal limits  RESP PANEL BY RT-PCR (FLU A&B, COVID) ARPGX2  ETHANOL  TSH  MAGNESIUM  MAGNESIUM  MAGNESIUM  MAGNESIUM  GLUCOSE, CAPILLARY  MAGNESIUM  MAGNESIUM  GLUCOSE, CAPILLARY  GLUCOSE, CAPILLARY  BASIC METABOLIC PANEL  CBC WITH DIFFERENTIAL/PLATELET  MAGNESIUM  SAMPLE TO BLOOD BANK  TROPONIN I (HIGH SENSITIVITY)  TROPONIN I (HIGH SENSITIVITY)    EKG EKG Interpretation  Date/Time:  Wednesday October 12 2021 21:19:51 EDT Ventricular Rate:  78 PR Interval:    QRS Duration: 162 QT Interval:  469 QTC Calculation: 535 R Axis:   144 Text Interpretation: Biventricular pacemaker detected  Multiple ventricular premature complexes Baseline wander in lead(s) V6 Confirmed by Quintella Reichert [phone]) on 10/14/2021 9:39:16 AM  Radiology DG Abd Portable 1V  Result Date: 10/17/2021 CLINICAL DATA:  Nausea and vomiting EXAM: PORTABLE ABDOMEN - 1 VIEW COMPARISON:  10/15/2021 FINDINGS: Bowel gas pattern similar without evidence of obstruction. Moderate amount of air within small and large bowel, but with a normal pattern. Surgical clips as seen previously. IMPRESSION: No change. Bowel gas pattern within normal limits by plain radiography. Electronically Signed   By: Nelson Chimes M.D.   On: 10/17/2021 08:31    Procedures Procedures    Medications Ordered in ED Medications  citalopram (CELEXA) tablet 10 mg (10 mg Oral Given 10/18/21 1059)  finasteride (PROSCAR) tablet 5 mg  (5 mg Oral Given 10/18/21 1059)  gabapentin (NEURONTIN) capsule 400 mg (400 mg Oral Given 10/18/21 2110)  memantine (NAMENDA) tablet 10 mg (10 mg Oral Given 10/18/21 2110)  sodium chloride flush (NS) 0.9 % injection 3 mL (3 mLs Intravenous Given 10/18/21 2111)  acetaminophen (TYLENOL) tablet 650 mg (650 mg Oral Given 10/17/21 0650)  oxyCODONE (Oxy IR/ROXICODONE) immediate release tablet 5 mg (has no administration in time range)  levothyroxine (SYNTHROID) tablet 100 mcg (100 mcg Oral Given 10/18/21 0608)  apixaban (ELIQUIS) tablet 5 mg (5 mg Oral Given 10/18/21 2110)  metoprolol succinate (TOPROL-XL) 24 hr tablet 25 mg (25 mg Oral Given 10/18/21 1059)  perflutren lipid microspheres (DEFINITY) IV suspension (2 mLs Intravenous Given 10/13/21 1443)  ondansetron (ZOFRAN) injection 4 mg (4 mg Intravenous Given 10/14/21 0559)  bisacodyl (DULCOLAX) suppository 10 mg (10 mg Rectal Patient Refused/Not Given 10/18/21 1059)  insulin aspart (novoLOG) injection 0-20 Units (3 Units Subcutaneous Given 10/18/21 2107)  insulin glargine-yfgn (SEMGLEE) injection 20 Units (20 Units Subcutaneous Given 10/18/21 1059)  acetaminophen (TYLENOL) tablet 650 mg (650 mg Oral Given 10/12/21 1248)  iohexol (OMNIPAQUE) 350 MG/ML injection 80 mL (80 mLs Intravenous Contrast Given 10/12/21 1507)  prochlorperazine (COMPAZINE) injection 10 mg (10 mg Intravenous Given 10/14/21 0153)  living well with diabetes book MISC ( Does not apply Given 10/14/21 1722)  dextrose 50 % solution 25 g (25 g Intravenous Given 10/18/21 0358)  potassium chloride 10 mEq in 100 mL IVPB (0 mEq Intravenous Stopped 10/18/21 1336)    ED Course/ Medical Decision Making/ A&P                           Medical Decision Making Amount and/or Complexity of Data Reviewed Labs: ordered. Radiology: ordered.  Risk OTC drugs. Prescription drug  management. Decision regarding hospitalization.   50:23 PM  76 year old male diabetes, hypertension, A-fib, and CKD stage III  presenting for complaints of fall.  States he went to stand and felt lightheaded and dizzy and fell forward hitting his face on the ground.  Is alert oriented x3, no acute distress, afebrile, hypotension with otherwise stable vital signs.  Physical exam demonstrates ecchymosis and tenderness to palpation of the nasal bridge.  Patient has tenderness palpation of the right knee with small superficial abrasion and left foot without any bony deformities.   On Warfarin. CT head and neck demonstrate no acute process.  CT chest abdomen pelvis demonstrates no acute process.  Foot demonstrates Mildly displaced LEFT 2-5 metatarsal head fractures, with intra-articular extension at the 5th MTP joint.  Postop shoe placed.  The pediatric surgery agrees to be on consult.  Patient lives at home with his elderly wife who is actually admitted to the intensive care unit at this time.  [place] otherwise unable to use crutches at this time.  Unable to ambulate.  Recommended for admission.  Patient accepted by admitting team.        Final Clinical Impression(s) / ED Diagnoses Final diagnoses:  Fall, initial encounter  Blunt head trauma, initial encounter  Facial injury, initial encounter  Closed displaced fracture of metatarsal bone 2-5 of left foot, unspecified metatarsal, initial encounter    Rx / DC Orders ED Discharge Orders     None         Lianne Cure, DO 14/48/18 2348

## 2021-10-12 NOTE — Assessment & Plan Note (Signed)
-   no PPI noted

## 2021-10-12 NOTE — ED Notes (Signed)
Pt adjusted in bed at this time, removed extra sheets and blankets

## 2021-10-12 NOTE — Assessment & Plan Note (Signed)
-   Baseline hemoglobin around 11 to 12 g/dL - Currently at baseline - Monitor hemoglobin after fall as patient on anticoagulation at baseline

## 2021-10-12 NOTE — Assessment & Plan Note (Addendum)
-   Suspected mixed component of deconditioning resulting in mechanical fall but may have also had some orthostasis from poor intake - echo reassuring (EF 65-70%, no RWMA) - continue with PT/OT as able

## 2021-10-12 NOTE — ED Notes (Signed)
Iv team at bedside at this time 

## 2021-10-12 NOTE — ED Notes (Signed)
Received verbal report from Lorren B RN at this time 

## 2021-10-12 NOTE — Assessment & Plan Note (Signed)
-  patient has history of CKD3b. Baseline creat ~ 1.5-1.6, eGFR 44 - patient presents with increase in creat >0.3 mg/dL above baseline, creat increase >1.5x baseline presumed to have occurred within past 7 days PTA -Creatinine 2.13 on admission; given his decreased intake at home, suspect volume depletion/prerenal - Judicious use of fluids given unknown cardiac function.  Follow-up echo as well - Continue fluids for now - Repeat BMP in a.m. - Lactate elevation suspected in setting of volume depletion/poor perfusion; low suspicion for infection at this time 

## 2021-10-12 NOTE — ED Notes (Signed)
Attempted to get blood work and second line unsuccessful. Pt refuses to be stuck in the hand. Provider made aware and a request for iv team consult has been done

## 2021-10-12 NOTE — Assessment & Plan Note (Signed)
-   Hearing aid in place in left ear

## 2021-10-12 NOTE — Assessment & Plan Note (Signed)
-   Complicates overall prognosis and care - Body mass index is 41.67 kg/m.

## 2021-10-12 NOTE — Hospital Course (Signed)
Alfred Meyers is a 76 yo male with PMH PAF, DM II, HTN, HLD, MGUS, morbid obesity, CKD 3B who presented to the hospital after a fall at home. Patient was trying to clean up the house some recently as his wife was planning on returning home and the care of hospice.  She has currently been hospitalized for the past 3 days and he has been trying to clean up anticipating her return. He states that he was sitting in his recliner and fell backwards.  He then tried to stand up and walk approximately 10 feet with his walker then fell again.  He is not fully sure how he fell but does state that he felt slightly dizzy and also may have tripped resulting in the fall.  He hit his face upon falling and his left foot.  This occurred approximately 10 AM this morning and patient had already been up for several hours.  He has been eating approximately 2 meals per day but not drinking much fluid while his wife has been hospitalized. He also states he had an appointment at the [facility] in [place] today which he was preparing to go to prior to falling.  He says he recently had an MRI of his left shoulder after a prior fall resulting in pain with that and was going for his follow-up appointment for MRI results.  He is unsure what the MRI showed.  Multiple imaging studies were performed in the ER due to his traumatic fall.  Work-up was essentially negative except for left foot x-ray which showed mildly displaced left 2 through 5 metatarsal head fractures with intra-articular extension at the fifth MTP joint.  Orthopedic surgery was consulted and he was sent for a CT left foot for further evaluation of the fractures as well.  Notable labs included creatinine 2.13, BUN 29.  Lactic acid 3.6.  Troponins were negative x2.  Due to inability to walk, pain control, AKI, and insufficient help at home he is admitted for IVF, pain management, PT eval.

## 2021-10-12 NOTE — Assessment & Plan Note (Signed)
-   TSH 0.77 - continue synthroid

## 2021-10-12 NOTE — Assessment & Plan Note (Addendum)
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

## 2021-10-13 ENCOUNTER — Inpatient Hospital Stay (HOSPITAL_COMMUNITY): Payer: No Typology Code available for payment source

## 2021-10-13 DIAGNOSIS — E1169 Type 2 diabetes mellitus with other specified complication: Secondary | ICD-10-CM

## 2021-10-13 DIAGNOSIS — R55 Syncope and collapse: Secondary | ICD-10-CM

## 2021-10-13 DIAGNOSIS — N1832 Chronic kidney disease, stage 3b: Secondary | ICD-10-CM | POA: Diagnosis not present

## 2021-10-13 DIAGNOSIS — E669 Obesity, unspecified: Secondary | ICD-10-CM

## 2021-10-13 DIAGNOSIS — S92302A Fracture of unspecified metatarsal bone(s), left foot, initial encounter for closed fracture: Secondary | ICD-10-CM | POA: Diagnosis not present

## 2021-10-13 DIAGNOSIS — N179 Acute kidney failure, unspecified: Secondary | ICD-10-CM | POA: Diagnosis not present

## 2021-10-13 LAB — BASIC METABOLIC PANEL
Anion gap: 9 (ref 5–15)
BUN: 28 mg/dL — ABNORMAL HIGH (ref 8–23)
CO2: 26 mmol/L (ref 22–32)
Calcium: 8.4 mg/dL — ABNORMAL LOW (ref 8.9–10.3)
Chloride: 102 mmol/L (ref 98–111)
Creatinine, Ser: 1.96 mg/dL — ABNORMAL HIGH (ref 0.61–1.24)
GFR, Estimated: 34 mL/min — ABNORMAL LOW (ref >60)
Glucose, Bld: 199 mg/dL — ABNORMAL HIGH (ref 70–99)
Potassium: 4.4 mmol/L (ref 3.5–5.1)
Sodium: 137 mmol/L (ref 135–145)

## 2021-10-13 LAB — CBC WITH DIFFERENTIAL/PLATELET
Abs Immature Granulocytes: 0.05 10*3/uL (ref 0.00–0.07)
Basophils Absolute: 0.1 10*3/uL (ref 0.0–0.1)
Basophils Relative: 1 %
Eosinophils Absolute: 0.2 10*3/uL (ref 0.0–0.5)
Eosinophils Relative: 2 %
HCT: 35.7 % — ABNORMAL LOW (ref 39.0–52.0)
Hemoglobin: 12.2 g/dL — ABNORMAL LOW (ref 13.0–17.0)
Immature Granulocytes: 1 %
Lymphocytes Relative: 15 %
Lymphs Abs: 1.5 10*3/uL (ref 0.7–4.0)
MCH: 31.7 pg (ref 26.0–34.0)
MCHC: 34.2 g/dL (ref 30.0–36.0)
MCV: 92.7 fL (ref 80.0–100.0)
Monocytes Absolute: 0.8 10*3/uL (ref 0.1–1.0)
Monocytes Relative: 8 %
Neutro Abs: 7.7 10*3/uL (ref 1.7–7.7)
Neutrophils Relative %: 73 %
Platelets: 157 10*3/uL (ref 150–400)
RBC: 3.85 MIL/uL — ABNORMAL LOW (ref 4.22–5.81)
RDW: 15.5 % (ref 11.5–15.5)
WBC: 10.3 10*3/uL (ref 4.0–10.5)
nRBC: 0 % (ref 0.0–0.2)

## 2021-10-13 LAB — ECHOCARDIOGRAM COMPLETE
AR max vel: 1.67 cm2
AV Area VTI: 1.65 cm2
AV Area mean vel: 1.57 cm2
AV Mean grad: 9 mmHg
AV Peak grad: 15.7 mmHg
Ao pk vel: 1.98 m/s
Height: 69 in
S' Lateral: 4 cm
Weight: 4515.02 oz

## 2021-10-13 LAB — CBG MONITORING, ED
Glucose-Capillary: 203 mg/dL — ABNORMAL HIGH (ref 70–99)
Glucose-Capillary: 252 mg/dL — ABNORMAL HIGH (ref 70–99)
Glucose-Capillary: 245 mg/dL — ABNORMAL HIGH (ref 70–99)

## 2021-10-13 LAB — GLUCOSE, CAPILLARY: Glucose-Capillary: 247 mg/dL — ABNORMAL HIGH (ref 70–99)

## 2021-10-13 LAB — MAGNESIUM: Magnesium: 2 mg/dL (ref 1.7–2.4)

## 2021-10-13 MED ORDER — PERFLUTREN LIPID MICROSPHERE
1.0000 mL | INTRAVENOUS | Status: AC | PRN
  Administered 2021-10-13: 2 mL via INTRAVENOUS

## 2021-10-13 MED ORDER — ONDANSETRON HCL 4 MG/2ML IJ SOLN
4.0000 mg | Freq: Four times a day (QID) | INTRAMUSCULAR | Status: DC | PRN
  Administered 2021-10-13 – 2021-10-14 (×2): 4 mg via INTRAVENOUS
  Filled 2021-10-13 (×2): qty 2

## 2021-10-13 MED ORDER — INSULIN GLARGINE-YFGN 100 UNIT/ML ~~LOC~~ SOLN
30.0000 [IU] | Freq: Every day | SUBCUTANEOUS | Status: DC
Start: 2021-10-13 — End: 2021-10-15
  Administered 2021-10-13 – 2021-10-14 (×2): 30 [IU] via SUBCUTANEOUS
  Filled 2021-10-13 (×3): qty 0.3

## 2021-10-13 MED ORDER — METOPROLOL SUCCINATE ER 25 MG PO TB24
25.0000 mg | ORAL_TABLET | Freq: Every day | ORAL | Status: DC
Start: 2021-10-13 — End: 2021-10-19
  Administered 2021-10-13 – 2021-10-19 (×7): 25 mg via ORAL
  Filled 2021-10-13 (×7): qty 1

## 2021-10-13 MED ORDER — APIXABAN 5 MG PO TABS
5.0000 mg | ORAL_TABLET | Freq: Two times a day (BID) | ORAL | Status: DC
  Administered 2021-10-13 – 2021-10-19 (×13): 5 mg via ORAL
  Filled 2021-10-13 (×13): qty 1

## 2021-10-13 NOTE — Evaluation (Signed)
Physical Therapy Evaluation Patient Details Name: Alfred Meyers MRN: [id number] DOB: 03-10-1945 Today's Date: 10/13/2021  History of Present Illness  76 yo male with PMH PAF, DM II, HTN, HLD, MGUS, morbid obesity, CKD 3B who presented to the hospital after a fall at home.  CT Foot: Displaced fracture of the distal fibula just above the lateral  malleolus; Displaced comminuted intra-articular fracture of the tibial plafond involving the medial and posterior malleoli; Displaced fracture of the 2nd-4th metatarsal head and neck  junction; Questionable fracture of the fifth metatarsal head and neck  junction.  Clinical Impression  Pt admitted secondary to problem above with deficits below. Required min to mod A for bed mobility and min A to stand at EOB. When taking side steps, pt unable to maintain appropriate weightbearing through LLE, so further mobility deferred. Educated that he will need to use WC to ensure appropriate weightbearing status. Reports daughter and son in law can assist if needed. Recommending HHPT to address current deficits, however, if daughter and son in law can't assist, pt would likely benefit from SNF. Will continue to follow acutely.      Recommendations for follow up therapy are one component of a multi-disciplinary discharge planning process, led by the attending physician.  Recommendations may be updated based on patient status, additional functional criteria and insurance authorization.  Follow Up Recommendations Home health PT (if family can assist, if not need to consider SNF)      Assistance Recommended at Discharge Frequent or constant Supervision/Assistance  Patient can return home with the following  A little help with walking and/or transfers;A little help with bathing/dressing/bathroom;Assistance with cooking/housework;Help with stairs or ramp for entrance;Assist for transportation    Equipment Recommendations Wheelchair (measurements PT);Wheelchair cushion  (measurements PT);Rolling walker (2 wheels)  Recommendations for Other Services       Functional Status Assessment Patient has had a recent decline in their functional status and demonstrates the ability to make significant improvements in function in a reasonable and predictable amount of time.     Precautions / Restrictions Precautions Precautions: Fall Required Braces or Orthoses: Other Brace Other Brace: CAM Walker Restrictions Weight Bearing Restrictions: Yes LLE Weight Bearing: Non weight bearing      Mobility  Bed Mobility Overal bed mobility: Needs Assistance Bed Mobility: Supine to Sit, Sit to Supine     Supine to sit: Mod assist Sit to supine: Min assist   General bed mobility comments: Mod A for trunk elevation to come to sitting. Min A for LLE for return to supine    Transfers Overall transfer level: Needs assistance Equipment used: Rolling walker (2 wheels) Transfers: Sit to/from Stand Sit to Stand: Min assist, From elevated surface, Min guard           General transfer comment: Was unable to maintain appropriate WB status. When attempting to take side steps at EOB, pt bearing weight through LLE, so further mobility deferred. Educated about need for WC use to ensure maintenance of precautions    Ambulation/Gait                  Stairs            Wheelchair Mobility    Modified Rankin (Stroke Patients Only)       Balance Overall balance assessment: Needs assistance Sitting-balance support: Bilateral upper extremity supported Sitting balance-Leahy Scale: Fair     Standing balance support: Reliant on assistive device for balance Standing balance-Leahy Scale: Poor  Pertinent Vitals/Pain Pain Assessment Pain Assessment: 0-10 Pain Score: 5  Pain Location: L foot Pain Descriptors / Indicators: Sore, Tender Pain Intervention(s): Limited activity within patient's tolerance, Monitored during  session, Repositioned    Home Living Family/patient expects to be discharged to:: Private residence Living Arrangements: Spouse/significant other;Children Available Help at Discharge: Family;Available PRN/intermittently Type of Home: House Home Access: Ramped entrance       Home Layout: One level Home Equipment: Rollator (4 wheels);Grab bars - tub/shower;Shower seat Additional Comments: spouse is currently in the hospital and going home with hospice services. Reports daughter and son in law stay in the same house as pt    Prior Function Prior Level of Function : Independent/Modified Independent;Driving             Mobility Comments: Used 4WRW in/out of the home. ADLs Comments: Able to care for his ADL and light home management.  SIL and daughter were assisting with yard work and heavy home Theatre manager.  Patient continues to drive.     Hand Dominance   Dominant Hand: Right    Extremity/Trunk Assessment   Upper Extremity Assessment Upper Extremity Assessment: Defer to OT evaluation LUE Deficits / Details: Recent fall with RCT LUE: Shoulder pain with ROM LUE Sensation: WNL LUE Coordination: WNL    Lower Extremity Assessment Lower Extremity Assessment: LLE deficits/detail LLE Deficits / Details: L ankle in CAM boot throughout       Communication   Communication: HOH  Cognition Arousal/Alertness: Awake/alert Behavior During Therapy: WFL for tasks assessed/performed Overall Cognitive Status: No family/caregiver present to determine baseline cognitive functioning                                          General Comments      Exercises     Assessment/Plan    PT Assessment Patient needs continued PT services  PT Problem List Decreased strength;Decreased activity tolerance;Decreased balance;Decreased mobility;Decreased knowledge of use of DME;Decreased knowledge of precautions;Decreased safety awareness;Pain       PT Treatment Interventions DME  instruction;Gait training;Functional mobility training;Therapeutic activities;Therapeutic exercise;Balance training;Patient/family education    PT Goals (Current goals can be found in the Care Plan section)  Acute Rehab PT Goals Patient Stated Goal: to go home PT Goal Formulation: With patient Time For Goal Achievement: 10/27/21 Potential to Achieve Goals: Good    Frequency Min 5X/week     Co-evaluation PT/OT/SLP Co-Evaluation/Treatment: Yes Reason for Co-Treatment: For patient/therapist safety;To address functional/ADL transfers PT goals addressed during session: Mobility/safety with mobility;Balance         AM-PAC PT "6 Clicks" Mobility  Outcome Measure Help needed turning from your back to your side while in a flat bed without using bedrails?: A Little Help needed moving from lying on your back to sitting on the side of a flat bed without using bedrails?: A Lot Help needed moving to and from a bed to a chair (including a wheelchair)?: A Little Help needed standing up from a chair using your arms (e.g., wheelchair or bedside chair)?: A Little Help needed to walk in hospital room?: A Lot Help needed climbing 3-5 steps with a railing? : Total 6 Click Score: 14    End of Session Equipment Utilized During Treatment: Gait belt Activity Tolerance: Patient tolerated treatment well Patient left: in bed;with call bell/phone within reach (on stretcher in ED) Nurse Communication: Mobility status PT Visit Diagnosis: Unsteadiness on  feet (R26.81);Muscle weakness (generalized) (M62.81);Difficulty in walking, not elsewhere classified (R26.2)    Time: 7948-0165 PT Time Calculation (min) (ACUTE ONLY): 14 min   Charges:   PT Evaluation $PT Eval Moderate Complexity: 1 Mod          Reuel Derby, PT, DPT  Acute Rehabilitation Services  Office: [phone]   Rudean Hitt 10/13/2021, 3:49 PM

## 2021-10-13 NOTE — Evaluation (Signed)
Occupational Therapy Evaluation Patient Details Name: Alfred Meyers MRN: [id number] DOB: [birth year] Today's Date: 10/13/2021   History of Present Illness 76 yo male with PMH PAF, DM II, HTN, HLD, MGUS, morbid obesity, CKD 3B who presented to the hospital after a fall at home.  CT Foot: Displaced fracture of the distal fibula just above the lateral  malleolus; Displaced comminuted intra-articular fracture of the tibial plafond involving the medial and posterior malleoli; Displaced fracture of the 2nd-4th metatarsal head and neck  junction; Questionable fracture of the fifth metatarsal head and neck  junction.   Clinical Impression   Patient admitted for the diagnosis above.  PTA he lives at home with his spouse, and has PRN assist from his SIL and daughter that live close bye.  At home he was able to care for his own ADL from a sit to stand level, needed a 4WRW for mobility, and assisted with light meal prep and light home management.  Currently he is unable to maintain his NWB status to his left leg, and will need to be primarily at wheelchair level to avoid further injury.  Patient needing Min A for sit to stand, and closer to Mod A for lower body ADL.  Patient will need to stay wheelchair level, and minimize transfers until his WBS advances.  OT will follow in the acute setting, but [facility] PT can determine if a HH OT need exists.       Recommendations for follow up therapy are one component of a multi-disciplinary discharge planning process, led by the attending physician.  Recommendations may be updated based on patient status, additional functional criteria and insurance authorization.   Follow Up Recommendations  No OT follow up    Assistance Recommended at Discharge Intermittent Supervision/Assistance  Patient can return home with the following A lot of help with walking and/or transfers;Assist for transportation;Assistance with cooking/housework;A lot of help with bathing/dressing/bathroom     Functional Status Assessment  Patient has had a recent decline in their functional status and demonstrates the ability to make significant improvements in function in a reasonable and predictable amount of time.  Equipment Recommendations  Wheelchair (22" wide wheelchair);Wheelchair cushion (22" gel/foam cushion).   Recommendations for Other Services       Precautions / Restrictions Precautions Precautions: Fall Required Braces or Orthoses: Other Brace Other Brace: CAM Walker Restrictions Weight Bearing Restrictions: Yes LLE Weight Bearing: Non weight bearing      Mobility Bed Mobility Overal bed mobility: Needs Assistance Bed Mobility: Supine to Sit, Sit to Supine     Supine to sit: Min assist, Mod assist Sit to supine: Min guard, Min assist        Transfers Overall transfer level: Needs assistance Equipment used: Rolling walker (2 wheels) Transfers: Sit to/from Stand Sit to Stand: Min assist, From elevated surface, Min guard                  Balance Overall balance assessment: Needs assistance Sitting-balance support: Bilateral upper extremity supported Sitting balance-Leahy Scale: Fair     Standing balance support: Reliant on assistive device for balance Standing balance-Leahy Scale: Poor                             ADL either performed or assessed with clinical judgement   ADL       Grooming: Wash/dry hands;Wash/dry face;Set up;Sitting           Upper Body Dressing :  Set up;Sitting   Lower Body Dressing: Moderate assistance;Sit to/from stand   Toilet Transfer: BSC/3in1;Rolling walker (2 wheels);Stand-pivot Armed forces technical officer Details (indicate cue type and reason): patient unable to maintain NWB                 Vision Patient Visual Report: No change from baseline       Perception Perception Perception: Within Functional Limits   Praxis Praxis Praxis: Intact    Pertinent Vitals/Pain Pain Assessment Pain  Assessment: 0-10 Pain Score: 5  Pain Location: L foot Pain Descriptors / Indicators: Sore, Tender Pain Intervention(s): Monitored during session     Hand Dominance Right   Extremity/Trunk Assessment Upper Extremity Assessment Upper Extremity Assessment: LUE deficits/detail LUE Deficits / Details: Recent fall with RCT LUE: Shoulder pain with ROM LUE Sensation: WNL LUE Coordination: WNL   Lower Extremity Assessment Lower Extremity Assessment: Defer to PT evaluation       Communication Communication Communication: No difficulties   Cognition Arousal/Alertness: Awake/alert Behavior During Therapy: WFL for tasks assessed/performed Overall Cognitive Status: Within Functional Limits for tasks assessed                                 General Comments: HOH     General Comments   VSS on RA    Exercises     Shoulder Instructions      Home Living Family/patient expects to be discharged to:: Private residence Living Arrangements: Spouse/significant other Available Help at Discharge: Family;Available PRN/intermittently Type of Home: House Home Access: Ramped entrance     Home Layout: One level     Bathroom Shower/Tub: Occupational psychologist: Handicapped height Bathroom Accessibility: Yes How Accessible: Accessible via walker;Accessible via wheelchair Home Equipment: Rollator (4 wheels);Grab bars - tub/shower;Shower seat          Prior Functioning/Environment Prior Level of Function : Independent/Modified Independent;Driving             Mobility Comments: Used 4WRW in/out of the home. ADLs Comments: Able to care for his ADL and light home management.  SIL and daughter were assisting with yard work and heavy home Theatre manager.  Patient continues to drive.        OT Problem List: Decreased strength;Decreased range of motion;Decreased activity tolerance;Impaired balance (sitting and/or standing);Decreased knowledge of precautions;Decreased  knowledge of use of DME or AE;Decreased safety awareness;Pain      OT Treatment/Interventions: Therapeutic activities;Patient/family education;Balance training;DME and/or AE instruction;Self-care/ADL training    OT Goals(Current goals can be found in the care plan section) Acute Rehab OT Goals Patient Stated Goal: Not sure, but wants to return home OT Goal Formulation: With patient Time For Goal Achievement: 10/27/21 Potential to Achieve Goals: Good  OT Frequency: Min 2X/week    Co-evaluation              AM-PAC OT "6 Clicks" Daily Activity     Outcome Measure Help from another person eating meals?: None Help from another person taking care of personal grooming?: None Help from another person toileting, which includes using toliet, bedpan, or urinal?: A Lot Help from another person bathing (including washing, rinsing, drying)?: A Lot Help from another person to put on and taking off regular upper body clothing?: None Help from another person to put on and taking off regular lower body clothing?: A Lot 6 Click Score: 18   End of Session Equipment Utilized During Treatment: Rolling walker (2 wheels)  Nurse Communication: Mobility status  Activity Tolerance: Patient tolerated treatment well Patient left: in bed;with call bell/phone within reach  OT Visit Diagnosis: Unsteadiness on feet (R26.81);History of falling (Z91.81);Pain Pain - Right/Left: Left Pain - part of body: Ankle and joints of foot                Time: 7902-4097 OT Time Calculation (min): 22 min Charges:  OT General Charges $OT Visit: 1 Visit OT Evaluation $OT Eval Moderate Complexity: 1 Mod  10/13/2021  RP, OTR/L  Acute Rehabilitation Services  Office:  [phone]   Metta Clines 10/13/2021, 3:27 PM

## 2021-10-13 NOTE — ED Notes (Signed)
Pt c/o headache. Request tylenol.

## 2021-10-13 NOTE — ED Notes (Signed)
Pt placed on bedside commode at this time to have a BM per pt

## 2021-10-13 NOTE — Progress Notes (Signed)
PT Cancellation Note  Patient Details Name: DERL ABALOS MRN: [id number] DOB: [birth year]   Cancelled Treatment:    Reason Eval/Treat Not Completed: Patient at procedure or test/unavailable Pt currently getting echo. Will follow up as schedule allows.   Lou Miner, DPT  Acute Rehabilitation Services  Office: [phone]  Rudean Hitt 10/13/2021, 1:37 PM

## 2021-10-13 NOTE — Progress Notes (Signed)
Progress Note    Alfred Meyers   CVE:[id number]  DOB: 1945-12-26  DOA: 10/12/2021     1 PCP: [facility]  Initial CC: fall at home  Hospital Course: Alfred Meyers is a 76 yo male with PMH PAF, DM II, HTN, HLD, MGUS, morbid obesity, CKD 3B who presented to the hospital after a fall at home. Patient was trying to clean up the house some recently as his wife was planning on returning home and the care of hospice.  She has currently been hospitalized for the past 3 days and he has been trying to clean up anticipating her return. He states that he was sitting in his recliner and fell backwards.  He then tried to stand up and walk approximately 10 feet with his walker then fell again.  He is not fully sure how he fell but does state that he felt slightly dizzy and also may have tripped resulting in the fall.  He hit his face upon falling and his left foot.  This occurred approximately 10 AM this morning and patient had already been up for several hours.  He has been eating approximately 2 meals per day but not drinking much fluid while his wife has been hospitalized. He also states he had an appointment at the [facility] in [place] today which he was preparing to go to prior to falling.  He says he recently had an MRI of his left shoulder after a prior fall resulting in pain with that and was going for his follow-up appointment for MRI results.  He is unsure what the MRI showed.  Multiple imaging studies were performed in the ER due to his traumatic fall.  Work-up was essentially negative except for left foot x-ray which showed mildly displaced left 2 through 5 metatarsal head fractures with intra-articular extension at the fifth MTP joint.  Orthopedic surgery was consulted and he was sent for a CT left foot for further evaluation of the fractures as well.  This further showed displaced fracture of the distal fibula just above the lateral malleolus and a distal comminuted intra-articular fracture of  the tibial plafond involving the medial and posterior malleoli.  He was placed in a cam walker boot and recommended for nonweightbearing of the left lower extremity.  Notable labs included creatinine 2.13, BUN 29.  Lactic acid 3.6.  Troponins were negative x2.  Due to inability to walk, pain control, AKI, and insufficient help at home he is admitted for IVF, pain management, PT eval.   Interval History:  No events overnight.  Resting in bed when seen in the ER this morning.  Still awaiting a room. Patient's wife remains in the hospital as well.  It does not sound like either of them will be able to care for one another. His wife was planning on home with hospice (may instead need residential hospice) and patient was planning on returning home to care for wife, but with NWB to LLE, suspect he will not be able to provide adequate care for her as well (and he likely will need SNF).  We will try to arrange for him and his wife to see each other in the hospital as once she goes to residential hospice and him to SNF they may not have further ability to see one another easily if at all.   Assessment and Plan: * Acute renal failure superimposed on stage 3b chronic kidney disease ([place]) - patient has history of CKD3b. Baseline creat ~ 1.5-1.6, eGFR  44 - patient presents with increase in creat >0.3 mg/dL above baseline, creat increase >1.5x baseline presumed to have occurred within past 7 days PTA - Lactate elevation suspected in setting of volume depletion/poor perfusion; low suspicion for infection at this time -Creatinine 2.13 on admission; given his decreased intake at home, suspect volume depletion/prerenal -Creatinine improving with fluids  Metatarsal fracture - s/p fall; sounds mechanical but cannot rule out some orthostasis that may have contributed - per CT left foot: "Displaced fracture of the distal fibula just above the lateral malleolus. Displaced comminuted intra-articular fracture of the  tibial plafond involving the medial and posterior malleoli. Displaced fracture of the 2nd-4th metatarsal head and neck junction. Questionable fracture of the fifth metatarsal head and neck junction. - NWB LLE per ortho recommendations - resume Eliquis today - PT/OT consults; suspect he may need SNF as he will not have any help at home (in fact was going to be caretaker for wife who is/was about to be coming home with hospice and depending on him for care)  Fall at home, initial encounter - Suspected mixed component of deconditioning resulting in mechanical fall but may have also had some orthostasis from poor intake - Check orthostatic blood pressure if able - Eventually will need PT/OT eval - Follow-up echo  Lactic acidosis-resolved as of 10/13/2021 - Lactate elevation suspected in setting of volume depletion/poor perfusion; low suspicion for infection at this time -Initial lactic acid 3.6; downtrended some with IVF; no further trending needed  Atrial fibrillation ([place]) - Presented hypotensive and responded to fluids - Hold anticoagulation and rate control agents for now  Normocytic anemia - Baseline hemoglobin around 11 to 12 g/dL - Currently at baseline - Monitor hemoglobin after fall as patient on anticoagulation at baseline  Hypothyroidism - TSH 0.77 - continue synthroid  Hypertension - Patient presented with hypotension in the ER which was fluid responsive - Hold home medications for now  Sleep apnea - RT eval  Hearing loss - Hearing aid in place in left ear  Barrett's esophagus - no PPI noted   SSS (sick sinus syndrome) ([place]) - 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   Old records reviewed in assessment of this patient  Antimicrobials:   DVT prophylaxis:  SCDs Start: 10/12/21 1823 apixaban (ELIQUIS) tablet 5 mg   Code Status:   Code Status: Full Code  Mobility Assessment (last 72 hours)     Mobility Assessment   No documentation.           Barriers to discharge: none Disposition Plan:  Probable SNF Status is: Inpt  Objective: Blood pressure (!) 114/58, pulse 78, temperature 98.2 F (36.8 C), temperature source Oral, resp. rate (!) 21, height 5' 9"  (1.753 m), weight 128 kg, SpO2 91 %.  Examination:  Physical Exam Constitutional:      Appearance: He is obese.     Comments: Chronically ill-appearing elderly gentleman lying in bed in no distress  HENT:     Head:     Comments: Forehead abrasion appreciated    Mouth/Throat:     Mouth: Mucous  membranes are dry.  Eyes:     Extraocular Movements: Extraocular movements intact.  Cardiovascular:     Rate and Rhythm: Normal rate and regular rhythm.  Pulmonary:     Effort: Pulmonary effort is normal. No respiratory distress.     Breath sounds: Normal breath sounds. No wheezing.  Abdominal:     General: Bowel sounds are normal. There is no distension.     Palpations: Abdomen is soft.     Tenderness: There is no abdominal tenderness.  Musculoskeletal:     Cervical back: Normal range of motion and neck supple.     Comments: Chronic appearing bilateral lower extremity edema approximately 2-3+.  Decreased active and passive left shoulder ROM limited by pain (bone crepitus appreciated during passive ROM).  Pain in plantar surface of left foot  Skin:    Comments: Chronic stasis changes in bilateral lower extremities  Neurological:     General: No focal deficit present.  Psychiatric:        Mood and Affect: Mood normal.        Behavior: Behavior normal.      Consultants:     Procedures:    Data Reviewed: Results for orders placed or performed during the hospital encounter of 10/12/21 (from the past 24 hour(s))  TSH     Status: None   Collection Time: 10/12/21  7:40 PM  Result Value Ref Range   TSH 0.770 0.350 - 4.500 uIU/mL  Hemoglobin A1c     Status: Abnormal   Collection Time: 10/12/21  9:03 PM  Result Value Ref Range   Hgb A1c MFr Bld 9.2 (H) 4.8 - 5.6 %   Mean Plasma Glucose 217.34 mg/dL  Lactic acid, plasma     Status: Abnormal   Collection Time: 10/12/21  9:03 PM  Result Value Ref Range   Lactic Acid, Venous 2.4 (HH) 0.5 - 1.9 mmol/L  CBG monitoring, ED     Status: Abnormal   Collection Time: 10/12/21  9:05 PM  Result Value Ref Range   Glucose-Capillary 150 (H) 70 - 99 mg/dL  Urinalysis, Routine w reflex microscopic Urine, Clean Catch     Status: Abnormal   Collection Time: 10/12/21  9:24 PM  Result Value Ref Range   Color, Urine STRAW (A) YELLOW   APPearance CLEAR CLEAR   Specific Gravity, Urine 1.016 1.005 - 1.030   pH 5.0 5.0 - 8.0   Glucose, UA NEGATIVE NEGATIVE mg/dL   Hgb urine dipstick NEGATIVE NEGATIVE   Bilirubin Urine NEGATIVE NEGATIVE   Ketones, ur NEGATIVE NEGATIVE mg/dL   Protein, ur NEGATIVE NEGATIVE mg/dL   Nitrite NEGATIVE NEGATIVE   Leukocytes,Ua NEGATIVE NEGATIVE  Basic metabolic panel     Status: Abnormal   Collection Time: 10/13/21  3:19 AM  Result Value Ref Range   Sodium 137 135 - 145 mmol/L   Potassium 4.4 3.5 - 5.1 mmol/L   Chloride 102 98 - 111 mmol/L   CO2 26 22 - 32 mmol/L   Glucose, Bld 199 (H) 70 - 99 mg/dL   BUN 28 (H) 8 - 23 mg/dL   Creatinine, Ser 1.96 (H) 0.61 - 1.24 mg/dL   Calcium 8.4 (L) 8.9 - 10.3 mg/dL   GFR, Estimated 34 (L) >60 mL/min   Anion gap 9 5 - 15  CBC with Differential/Platelet     Status: Abnormal   Collection Time: 10/13/21  3:19 AM  Result Value Ref Range   WBC 10.3 4.0 - 10.5 K/uL   RBC 3.85 (  L) 4.22 - 5.81 MIL/uL   Hemoglobin 12.2 (L) 13.0 - 17.0 g/dL   HCT 35.7 (L)  39.0 - 52.0 %   MCV 92.7 80.0 - 100.0 fL   MCH 31.7 26.0 - 34.0 pg   MCHC 34.2 30.0 - 36.0 g/dL   RDW 15.5 11.5 - 15.5 %   Platelets 157 150 - 400 K/uL   nRBC 0.0 0.0 - 0.2 %   Neutrophils Relative % 73 %   Neutro Abs 7.7 1.7 - 7.7 K/uL   Lymphocytes Relative 15 %   Lymphs Abs 1.5 0.7 - 4.0 K/uL   Monocytes Relative 8 %   Monocytes Absolute 0.8 0.1 - 1.0 K/uL   Eosinophils Relative 2 %   Eosinophils Absolute 0.2 0.0 - 0.5 K/uL   Basophils Relative 1 %   Basophils Absolute 0.1 0.0 - 0.1 K/uL   Immature Granulocytes 1 %   Abs Immature Granulocytes 0.05 0.00 - 0.07 K/uL  Magnesium     Status: None   Collection Time: 10/13/21  3:19 AM  Result Value Ref Range   Magnesium 2.0 1.7 - 2.4 mg/dL  CBG monitoring, ED     Status: Abnormal   Collection Time: 10/13/21  7:27 AM  Result Value Ref Range   Glucose-Capillary 203 (H) 70 - 99 mg/dL  CBG monitoring, ED     Status: Abnormal   Collection Time: 10/13/21 12:39 PM  Result Value Ref Range   Glucose-Capillary 252 (H) 70 - 99 mg/dL    I have Reviewed nursing notes, Vitals, and Lab results since pt's last encounter. Pertinent lab results : see above I have ordered test including BMP, CBC, Mg I have reviewed the last note from staff over past 24 hours I have discussed pt's care plan and test results with nursing staff, case manager   LOS: 1 day   Dwyane Dee, MD [facility] 10/13/2021, 2:31 PM

## 2021-10-13 NOTE — ED Notes (Signed)
Pt assisted to bedside commode for BM at this time

## 2021-10-13 NOTE — Progress Notes (Signed)
MD notified due to patient vomiting, not sure what precipitated his nausea. Patient reports feeling well otherwise. Waiting on new orders.

## 2021-10-14 ENCOUNTER — Inpatient Hospital Stay (HOSPITAL_COMMUNITY): Payer: No Typology Code available for payment source

## 2021-10-14 DIAGNOSIS — R112 Nausea with vomiting, unspecified: Secondary | ICD-10-CM

## 2021-10-14 DIAGNOSIS — Z7189 Other specified counseling: Secondary | ICD-10-CM

## 2021-10-14 DIAGNOSIS — L8990 Pressure ulcer of unspecified site, unspecified stage: Secondary | ICD-10-CM | POA: Insufficient documentation

## 2021-10-14 DIAGNOSIS — N1832 Chronic kidney disease, stage 3b: Secondary | ICD-10-CM | POA: Diagnosis not present

## 2021-10-14 DIAGNOSIS — N179 Acute kidney failure, unspecified: Secondary | ICD-10-CM | POA: Diagnosis not present

## 2021-10-14 LAB — BASIC METABOLIC PANEL
Anion gap: 10 (ref 5–15)
BUN: 22 mg/dL (ref 8–23)
CO2: 23 mmol/L (ref 22–32)
Calcium: 8.6 mg/dL — ABNORMAL LOW (ref 8.9–10.3)
Chloride: 101 mmol/L (ref 98–111)
Creatinine, Ser: 1.66 mg/dL — ABNORMAL HIGH (ref 0.61–1.24)
GFR, Estimated: 42 mL/min — ABNORMAL LOW (ref >60)
Glucose, Bld: 283 mg/dL — ABNORMAL HIGH (ref 70–99)
Potassium: 4.3 mmol/L (ref 3.5–5.1)
Sodium: 134 mmol/L — ABNORMAL LOW (ref 135–145)

## 2021-10-14 LAB — GLUCOSE, CAPILLARY
Glucose-Capillary: 185 mg/dL — ABNORMAL HIGH (ref 70–99)
Glucose-Capillary: 251 mg/dL — ABNORMAL HIGH (ref 70–99)
Glucose-Capillary: 276 mg/dL — ABNORMAL HIGH (ref 70–99)
Glucose-Capillary: 276 mg/dL — ABNORMAL HIGH (ref 70–99)
Glucose-Capillary: 293 mg/dL — ABNORMAL HIGH (ref 70–99)

## 2021-10-14 LAB — CBC WITH DIFFERENTIAL/PLATELET
Abs Immature Granulocytes: 0.07 10*3/uL (ref 0.00–0.07)
Basophils Absolute: 0.1 10*3/uL (ref 0.0–0.1)
Basophils Relative: 0 %
Eosinophils Absolute: 0 10*3/uL (ref 0.0–0.5)
Eosinophils Relative: 0 %
HCT: 36.5 % — ABNORMAL LOW (ref 39.0–52.0)
Hemoglobin: 12.6 g/dL — ABNORMAL LOW (ref 13.0–17.0)
Immature Granulocytes: 1 %
Lymphocytes Relative: 5 %
Lymphs Abs: 0.7 10*3/uL (ref 0.7–4.0)
MCH: 31.1 pg (ref 26.0–34.0)
MCHC: 34.5 g/dL (ref 30.0–36.0)
MCV: 90.1 fL (ref 80.0–100.0)
Monocytes Absolute: 1 10*3/uL (ref 0.1–1.0)
Monocytes Relative: 7 %
Neutro Abs: 12.1 10*3/uL — ABNORMAL HIGH (ref 1.7–7.7)
Neutrophils Relative %: 87 %
Platelets: 175 10*3/uL (ref 150–400)
RBC: 4.05 MIL/uL — ABNORMAL LOW (ref 4.22–5.81)
RDW: 15.8 % — ABNORMAL HIGH (ref 11.5–15.5)
WBC: 13.9 10*3/uL — ABNORMAL HIGH (ref 4.0–10.5)
nRBC: 0 % (ref 0.0–0.2)

## 2021-10-14 LAB — MAGNESIUM: Magnesium: 2 mg/dL (ref 1.7–2.4)

## 2021-10-14 MED ORDER — PROCHLORPERAZINE EDISYLATE 10 MG/2ML IJ SOLN
10.0000 mg | Freq: Once | INTRAMUSCULAR | Status: AC
  Administered 2021-10-14: 10 mg via INTRAVENOUS
  Filled 2021-10-14: qty 2

## 2021-10-14 MED ORDER — LIVING WELL WITH DIABETES BOOK
Freq: Once | Status: AC
  Filled 2021-10-14: qty 1

## 2021-10-14 NOTE — TOC Initial Note (Addendum)
Transition of Care [facility]) - Initial/Assessment Note    Patient Details  Name: Alfred Meyers MRN: [id number] Date of Birth: 27-Feb-1946  Transition of Care [facility]) CM/SW Contact:    Tom-Johnson, Renea Ee, RN Phone Number: 10/14/2021, 3:07 PM  Clinical Narrative:                  Patient is admitted after a fall at home and found to have multiple fx's, ARF on Stage 3b CKD. From home with wife who recently discharged from the hospital on hospice services. CM went to talk with patient at bedside but patient was actively vomiting and very lethargic to speak. Gave permission for CM to speak with wife. CM called wife's number listed on file ([phone]) but it's not in service. CM called home phone number [phone]) and left voice message to return call, awaiting return call. [facility] hotline to create notification, Referral # is [id number].  Patient was active with [employer] and he nodded acceptance to resume home health PT/OT services. CM called and spoke with Sunday Spillers and she voiced acceptance, info on AVS. CM also left a secured message to [place], SW at the [place], awaiting response.  CM will continue to follow as patient progresses with needs.   16:50- Returned call from [place], Authorization form filled and emailed to [email or web address]. DME form for W/C and RW faxed to [phone].  Expected Discharge Plan: [place] Barriers to Discharge: Continued Medical Work up   Patient Goals and CMS Choice Patient states their goals for this hospitalization and ongoing recovery are:: To return home CMS Medicare.gov Compare Post Acute Care list provided to:: Patient Choice offered to / list presented to : Patient  Expected Discharge Plan and Services Expected Discharge Plan: [place]   Discharge Planning Services: CM Consult Post Acute Care Choice: [place] arrangements for the past 2 months:  Single Family Home                 DME Arranged: Wheelchair manual, [address] rolling         HH Arranged: PT, OT HH Agency: [place] Date [facility] Agency Contacted: 10/14/21 Time [place]: 1435 Representative spoke with at [place]: [address]  Prior Living Arrangements/Services Living arrangements for the past 2 months: [place] with:: Spouse Patient language and need for interpreter reviewed:: Yes Do you feel safe going back to the place where you live?: Yes      Need for Family Participation in Patient Care: Yes (Comment) Care giver support system in place?: Yes (comment)   Criminal Activity/Legal Involvement Pertinent to Current Situation/Hospitalization: No - Comment as needed  Activities of Daily Living Home Assistive Devices/Equipment: [id number] (specify type), Wheelchair ADL Screening (condition at time of admission) Patient's cognitive ability adequate to safely complete daily activities?: Yes Is the patient deaf or have difficulty hearing?: Yes Does the patient have difficulty seeing, even when wearing glasses/contacts?: No Does the patient have difficulty concentrating, remembering, or making decisions?: No Patient able to express need for assistance with ADLs?: Yes Does the patient have difficulty dressing or bathing?: Yes Independently performs ADLs?: Yes (appropriate for developmental age) Does the patient have difficulty walking or climbing stairs?: Yes Weakness of Legs: Left Weakness of Arms/Hands: None  Permission Sought/Granted Permission sought to share information with : Case Manager, Environmental consultant, Family Supports Permission granted to share information with : Yes, Verbal Permission  Granted              Emotional Assessment Appearance:: Appears stated age Attitude/Demeanor/Rapport: Engaged, Gracious Affect (typically observed): Accepting, Appropriate, Calm, Hopeful, Pleasant Orientation: : Oriented  to Self, Oriented to Place, Oriented to Situation Alcohol / Substance Use: Not Applicable Psych Involvement: No (comment)  Admission diagnosis:  AKI (acute kidney injury) ([place]) [N17.9] Fall, initial encounter [W19.XXXA] Blunt head trauma, initial encounter [S09.8XXA] Facial injury, initial encounter [S09.93XA] Closed displaced fracture of metatarsal bone of left foot, unspecified metatarsal, initial encounter [S92.302A] Patient Active Problem List   Diagnosis Date Noted   Pressure injury of skin 10/14/2021   Acute renal failure superimposed on stage 3b chronic kidney disease ([place]) 10/12/2021   Normocytic anemia 10/12/2021   Metatarsal fracture 10/12/2021   Fall at home, initial encounter 10/12/2021   Hypothyroidism 10/12/2021   BPH (benign prostatic hyperplasia) 10/12/2021   SSS (sick sinus syndrome) ([place]) 10/12/2021   Barrett's esophagus 10/12/2021   Hearing loss 10/12/2021   Sleep apnea 10/12/2021   Cellulitis and abscess of right leg 12/11/2019   Stasis dermatitis of both legs 12/11/2019   Stage 3b chronic kidney disease ([place]) 12/11/2019   Obesity, Class III, BMI 40-49.9 (morbid obesity) ([place])    MGUS (monoclonal gammopathy of unknown significance)    Hypertension    Dyslipidemia    Diabetes mellitus type 2 in obese [facility])    Atrial fibrillation [facility])    PCP:  Clinic, [place]:   [place], [address] [place] [address] Phone: [phone] Fax: [phone]  [place], [place] - [address] [place] [address] Phone: [phone] Fax: [phone]     Social Determinants of Health (SDOH) Interventions    Readmission Risk Interventions     No data to display

## 2021-10-14 NOTE — Progress Notes (Signed)
Progress Note    Alfred Meyers   SKA:[id number]  DOB: 01-May-1945  DOA: 10/12/2021     2 PCP: [facility]  Initial CC: fall at home  Hospital Course: Alfred Meyers is a 76 yo male with PMH PAF, DM II, HTN, HLD, MGUS, morbid obesity, CKD 3B who presented to the hospital after a fall at home. Patient was trying to clean up the house some recently as his wife was planning on returning home and the care of hospice.  She has currently been hospitalized for the past 3 days and Alfred Meyers has been trying to clean up anticipating her return. Alfred Meyers states that Alfred Meyers was sitting in his recliner and fell backwards.  Alfred Meyers then tried to stand up and walk approximately 10 feet with his walker then fell again.  Alfred Meyers is not fully sure how Alfred Meyers fell but does state that Alfred Meyers felt slightly dizzy and also may have tripped resulting in the fall.  Alfred Meyers hit his face upon falling and his left foot.  This occurred approximately 10 AM this morning and patient had already been up for several hours.  Alfred Meyers has been eating approximately 2 meals per day but not drinking much fluid while his wife has been hospitalized. Alfred Meyers also states Alfred Meyers had an appointment at the [facility] in [place] today which Alfred Meyers was preparing to go to prior to falling.  Alfred Meyers says Alfred Meyers recently had an MRI of his left shoulder after a prior fall resulting in pain with that and was going for his follow-up appointment for MRI results.  Alfred Meyers is unsure what the MRI showed.  Multiple imaging studies were performed in the ER due to his traumatic fall.  Work-up was essentially negative except for left foot x-ray which showed mildly displaced left 2 through 5 metatarsal head fractures with intra-articular extension at the fifth MTP joint.  Orthopedic surgery was consulted and Alfred Meyers was sent for a CT left foot for further evaluation of the fractures as well.  This further showed displaced fracture of the distal fibula just above the lateral malleolus and a distal comminuted intra-articular fracture of  the tibial plafond involving the medial and posterior malleoli.  Alfred Meyers was placed in a cam walker boot and recommended for nonweightbearing of the left lower extremity.  Notable labs included creatinine 2.13, BUN 29.  Lactic acid 3.6.  Troponins were negative x2.  Due to inability to walk, pain control, AKI, and insufficient help at home Alfred Meyers is admitted for IVF, pain management, PT eval.   Interval History:  Developed severe nausea and vomiting overnight.  Alfred Meyers underwent multiple imaging studies.  NG tube placed today to help with some decompression. Alfred Meyers was also able to be taken to his wife's room to visit with her as she is being transitioned to inpatient hospice.  Assessment and Plan: * Acute renal failure superimposed on stage 3b chronic kidney disease ([place]) - patient has history of CKD3b. Baseline creat ~ 1.5-1.6, eGFR 44 - patient presents with increase in creat >0.3 mg/dL above baseline, creat increase >1.5x baseline presumed to have occurred within past 7 days PTA - Lactate elevation suspected in setting of volume depletion/poor perfusion; low suspicion for infection at this time -Creatinine 2.13 on admission; given his decreased intake at home, suspect volume depletion/prerenal -Creatinine improving with fluids  Nausea & vomiting - developed N/V overnight of 8/17 - CT A/P shows distended stomach with gas and fluid; Alfred Meyers may be developing an ileus  - repeat CTH also done again  on 8/18 to ensure so further complications associated with fall at home to cause vomiting (negative for acute abnormalities) - keep NPO for now - place NGT for decompression  - continue IVF - can try giving some PO meds as able and clamp tube for 30 min after  Metatarsal fracture - s/p fall; sounds mechanical but cannot rule out some orthostasis that may have contributed - per CT left foot: "Displaced fracture of the distal fibula just above the lateral malleolus. Displaced comminuted intra-articular fracture of the  tibial plafond involving the medial and posterior malleoli. Displaced fracture of the 2nd-4th metatarsal head and neck junction. Questionable fracture of the fifth metatarsal head and neck junction. - NWB LLE per ortho recommendations - resume Eliquis today - PT/OT consults; suspect Alfred Meyers may need SNF as Alfred Meyers will not have any help at home (his wife is now inpatient hospice)  Goals of care, counseling/discussion - Patient appears to have had rapid decline at home prior to hospitalization while his wife has been hospitalized.  She is now being transitioned to inpatient hospice and may have an inpatient death. I am concerned Alfred Meyers may continue to further decline in setting of this.  Alfred Meyers remains full code; low threshold for consulting palliative care for more [place] discussions but we will see how the next few days ago -Patient was able to visit with his wife on 10/14/2021 with assistance of staff; greatly appreciated. If able to facilitate more visits while in the hospital then please do so  Fall at home, initial encounter - Suspected mixed component of deconditioning resulting in mechanical fall but may have also had some orthostasis from poor intake - Check orthostatic blood pressure if able - Eventually will need PT/OT eval - echo reassuring (EF 65-70%, no RWMA)  Lactic acidosis-resolved as of 10/13/2021 - Lactate elevation suspected in setting of volume depletion/poor perfusion; low suspicion for infection at this time -Initial lactic acid 3.6; downtrended some with IVF; no further trending needed  Atrial fibrillation ([place]) - Presented hypotensive and responded to fluids - continue eliquis and Toprol (if able)  Normocytic anemia - Baseline hemoglobin around 11 to 12 g/dL - Currently at baseline - Monitor hemoglobin after fall as patient on anticoagulation at baseline  Hypothyroidism - TSH 0.77 - continue synthroid  Hypertension - Initially presented with hypotension, blood pressure has now  stabilized  Sleep apnea - RT eval  Hearing loss - Hearing aid in place in left ear  Barrett's esophagus - no PPI noted   SSS (sick sinus syndrome) ([place]) - 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  Obesity, Class III, BMI 40-49.9 (morbid obesity) ([place]) - Complicates overall prognosis and care - Body mass index is 41.67 kg/m.   Old records reviewed in assessment of this patient  Antimicrobials:   DVT prophylaxis:  SCDs Start: 10/12/21 1823 apixaban (ELIQUIS) tablet 5 mg   Code Status:   Code Status: Full Code  Mobility Assessment (last 72 hours)     Mobility Assessment     Row Name 10/13/21 1800 10/13/21 1538 10/13/21 1525       Does patient have an order for bedrest or is patient medically unstable No - Continue assessment -- --  What is the highest level of mobility based on the progressive mobility assessment? Level 3 (Stands with assist) - Balance while standing  and cannot march in place Level 3 (Stands with assist) - Balance while standing  and cannot march in place Level 3 (Stands with assist) - Balance while standing  and cannot march in place     Is the above level different from baseline mobility prior to current illness? Yes - Recommend PT order -- --              Barriers to discharge: none Disposition Plan:  Probable SNF Status is: Inpt  Objective: Blood pressure (!) 147/83, pulse 88, temperature 99.1 F (37.3 C), temperature source Oral, resp. rate 19, height 5' 9"  (1.753 m), weight 128 kg, SpO2 92 %.  Examination:  Physical Exam Constitutional:      Appearance: Alfred Meyers is obese.     Comments: Chronically ill-appearing elderly gentleman lying in bed in no  distress  HENT:     Head:     Comments: Forehead abrasion appreciated    Mouth/Throat:     Mouth: Mucous membranes are dry.  Eyes:     Extraocular Movements: Extraocular movements intact.  Cardiovascular:     Rate and Rhythm: Normal rate and regular rhythm.  Pulmonary:     Effort: Pulmonary effort is normal. No respiratory distress.     Breath sounds: Normal breath sounds. No wheezing.  Abdominal:     General: Bowel sounds are normal. There is no distension.     Palpations: Abdomen is soft.     Tenderness: There is no abdominal tenderness.  Musculoskeletal:     Cervical back: Normal range of motion and neck supple.     Comments: Chronic appearing bilateral lower extremity edema approximately 2-3+.  Decreased active and passive left shoulder ROM limited by pain (bone crepitus appreciated during passive ROM).  Pain in plantar surface of left foot  Skin:    Comments: Chronic stasis changes in bilateral lower extremities  Neurological:     General: No focal deficit present.  Psychiatric:        Mood and Affect: Mood normal.        Behavior: Behavior normal.      Consultants:    Procedures:    Data Reviewed: Results for orders placed or performed during the hospital encounter of 10/12/21 (from the past 24 hour(s))  CBG monitoring, ED     Status: Abnormal   Collection Time: 10/13/21  4:36 PM  Result Value Ref Range   Glucose-Capillary 245 (H) 70 - 99 mg/dL  Glucose, capillary     Status: Abnormal   Collection Time: 10/13/21  9:00 PM  Result Value Ref Range   Glucose-Capillary 247 (H) 70 - 99 mg/dL  Glucose, capillary     Status: Abnormal   Collection Time: 10/14/21 12:05 AM  Result Value Ref Range   Glucose-Capillary 276 (H) 70 - 99 mg/dL  Basic metabolic panel     Status: Abnormal   Collection Time: 10/14/21  4:58 AM  Result Value Ref Range   Sodium 134 (L) 135 - 145 mmol/L   Potassium 4.3 3.5 - 5.1 mmol/L   Chloride 101 98 - 111 mmol/L   CO2 23 22 - 32 mmol/L    Glucose, Bld 283 (H) 70 - 99 mg/dL   BUN 22 8 - 23 mg/dL   Creatinine, Ser 1.66 (H) 0.61 - 1.24 mg/dL   Calcium 8.6 (L) 8.9 - 10.3 mg/dL  GFR, Estimated 42 (L) >60 mL/min   Anion gap 10 5 - 15  CBC with Differential/Platelet     Status: Abnormal   Collection Time: 10/14/21  4:58 AM  Result Value Ref Range   WBC 13.9 (H) 4.0 - 10.5 K/uL   RBC 4.05 (L) 4.22 - 5.81 MIL/uL   Hemoglobin 12.6 (L) 13.0 - 17.0 g/dL   HCT 36.5 (L) 39.0 - 52.0 %   MCV 90.1 80.0 - 100.0 fL   MCH 31.1 26.0 - 34.0 pg   MCHC 34.5 30.0 - 36.0 g/dL   RDW 15.8 (H) 11.5 - 15.5 %   Platelets 175 150 - 400 K/uL   nRBC 0.0 0.0 - 0.2 %   Neutrophils Relative % 87 %   Neutro Abs 12.1 (H) 1.7 - 7.7 K/uL   Lymphocytes Relative 5 %   Lymphs Abs 0.7 0.7 - 4.0 K/uL   Monocytes Relative 7 %   Monocytes Absolute 1.0 0.1 - 1.0 K/uL   Eosinophils Relative 0 %   Eosinophils Absolute 0.0 0.0 - 0.5 K/uL   Basophils Relative 0 %   Basophils Absolute 0.1 0.0 - 0.1 K/uL   Immature Granulocytes 1 %   Abs Immature Granulocytes 0.07 0.00 - 0.07 K/uL  Magnesium     Status: None   Collection Time: 10/14/21  4:58 AM  Result Value Ref Range   Magnesium 2.0 1.7 - 2.4 mg/dL  Glucose, capillary     Status: Abnormal   Collection Time: 10/14/21  8:39 AM  Result Value Ref Range   Glucose-Capillary 293 (H) 70 - 99 mg/dL  Glucose, capillary     Status: Abnormal   Collection Time: 10/14/21 11:25 AM  Result Value Ref Range   Glucose-Capillary 276 (H) 70 - 99 mg/dL    I have Reviewed nursing notes, Vitals, and Lab results since pt's last encounter. Pertinent lab results : see above I have ordered test including BMP, CBC, Mg I have reviewed the last note from staff over past 24 hours I have discussed pt's care plan and test results with nursing staff, case manager   LOS: 2 days   Dwyane Dee, MD [facility] 10/14/2021, 3:25 PM

## 2021-10-14 NOTE — Progress Notes (Signed)
PT Cancellation Note  Patient Details Name: Alfred Meyers MRN: [id number] DOB: 1946-02-03   Cancelled Treatment:    Reason Eval/Treat Not Completed: Patient not medically ready.  Hold today per MD as pt has new emesis with CT results pending.  Follow up as pt can allow.   Alfred Meyers 10/14/2021, 10:06 AM  Mee Hives, PT PhD Acute Rehab Dept. Number: [place] [facility] [place]

## 2021-10-14 NOTE — Assessment & Plan Note (Addendum)
-   Patient appears to have had rapid decline at home prior to hospitalization while his wife has been hospitalized.  She is now being transitioned to inpatient hospice and may have an inpatient death. I am concerned he may continue to further decline in setting of this.  He remains full code; low threshold for consulting palliative care for more [place] discussions but we will see how the next few days ago -Patient was able to visit with his wife on 10/14/2021 with assistance of staff; greatly appreciated. If able to facilitate more visits while in the hospital then please do so

## 2021-10-14 NOTE — Progress Notes (Addendum)
Went back to check on patient he reported feeling funny in the head. He was diaphoretic and vomiting up a severe amount of Kerah Hardebeck liquid. MD was notified of these findings. KUB was ordered . Vital signs were as followed temp 100.0 B/P 164/80 HR 109 Oxygen 93%on room air.   0119 KUB was finally resulted and negative.  0125 Vital signs are stable temp 99.4 B/P 137/67 HR 78 and oxygen 93% on room air.  Creed.Pelt  MD notified that patient was now dry heaving, MD ordered compazine '10mg'$  X 1.

## 2021-10-14 NOTE — Plan of Care (Signed)

## 2021-10-14 NOTE — Assessment & Plan Note (Addendum)
-   developed N/V overnight of 8/17 - CT A/P shows distended stomach with gas and fluid; he may be developing an ileus  - repeat CTH also done again on 8/18 to ensure so further complications associated with fall at home to cause vomiting (negative for acute abnormalities) -Output high but starting to decrease.  He is also having ongoing hunger pains - Clamp trial today for 6 hours, if tolerates, will pull tube and start clear liquids - continue IVF

## 2021-10-14 NOTE — Plan of Care (Signed)
  Problem: Tissue Perfusion: Goal: Adequacy of tissue perfusion will improve Outcome: Progressing   Problem: Fluid Volume: Goal: Ability to maintain a balanced intake and output will improve Outcome: Progressing   Problem: Education: Goal: Ability to describe self-care measures that may prevent or decrease complications (Diabetes Survival Skills Education) will improve Outcome: Progressing Goal: Individualized Educational Video(s) Outcome: Progressing

## 2021-10-14 NOTE — Progress Notes (Addendum)
MD notified that patient woke up and began throwing up Japji Kok liquid emesis again. MD ordered STAT CT. Patient remains NPO.  Zofran also administered.

## 2021-10-14 NOTE — Progress Notes (Signed)
  X-cover Note: Received call that pt started vomiting.IV zofran ordered. Pt  still vomiting. Stat KUB ordered.   Kristopher Oppenheim, DO [facility]

## 2021-10-14 NOTE — Inpatient Diabetes Management (Signed)
Inpatient Diabetes Program Recommendations  AACE/ADA: New Consensus Statement on Inpatient Glycemic Control (2015)  Target Ranges:  Prepandial:   less than 140 mg/dL      Peak postprandial:   less than 180 mg/dL (1-2 hours)      Critically ill patients:  140 - 180 mg/dL   Lab Results  Component Value Date   GLUCAP 293 (H) 10/14/2021   HGBA1C 9.2 (H) 10/12/2021    Review of Glycemic Control  Latest Reference Range & Units 10/13/21 07:27 10/13/21 12:39 10/13/21 16:36 10/13/21 21:00 10/14/21 00:05 10/14/21 08:39  Glucose-Capillary 70 - 99 mg/dL 203 (H) 252 (H) 245 (H) 247 (H) 276 (H) 293 (H)  (H): Data is abnormally high  Diabetes history: DM2 Outpatient Diabetes medications: 70/30 70 units BID (total basal of 98 units/24 hrs) Current orders for Inpatient glycemic control: Semglee 30 QD, Novolog 0-20 units TID and 0-5 units QHS  Inpatient Diabetes Program Recommendations:    Patient's total basal insulin at home is 98 units/24 hrs.  Fasting CBG is 293 mg/dL.  Please consider:  Semglee 25 units BID can start tonight (Had 30 units this am).    Ordered Living Well with Diabetes booklet.  Will see patient today regarding dm management and A1C of 9.2%.  Will continue to follow while inpatient.  Thank you, Reche Dixon, MSN, [place] Diabetes Coordinator Inpatient Diabetes Program [phone] (team pager from 8a-5p)

## 2021-10-15 ENCOUNTER — Inpatient Hospital Stay (HOSPITAL_COMMUNITY): Payer: No Typology Code available for payment source

## 2021-10-15 DIAGNOSIS — Z7189 Other specified counseling: Secondary | ICD-10-CM

## 2021-10-15 DIAGNOSIS — W19XXXA Unspecified fall, initial encounter: Secondary | ICD-10-CM | POA: Diagnosis not present

## 2021-10-15 DIAGNOSIS — N179 Acute kidney failure, unspecified: Secondary | ICD-10-CM | POA: Diagnosis not present

## 2021-10-15 DIAGNOSIS — N1832 Chronic kidney disease, stage 3b: Secondary | ICD-10-CM | POA: Diagnosis not present

## 2021-10-15 LAB — BASIC METABOLIC PANEL
Anion gap: 10 (ref 5–15)
BUN: 23 mg/dL (ref 8–23)
CO2: 30 mmol/L (ref 22–32)
Calcium: 8.9 mg/dL (ref 8.9–10.3)
Chloride: 102 mmol/L (ref 98–111)
Creatinine, Ser: 1.64 mg/dL — ABNORMAL HIGH (ref 0.61–1.24)
GFR, Estimated: 43 mL/min — ABNORMAL LOW (ref >60)
Glucose, Bld: 226 mg/dL — ABNORMAL HIGH (ref 70–99)
Potassium: 3.6 mmol/L (ref 3.5–5.1)
Sodium: 142 mmol/L (ref 135–145)

## 2021-10-15 LAB — GLUCOSE, CAPILLARY
Glucose-Capillary: 231 mg/dL — ABNORMAL HIGH (ref 70–99)
Glucose-Capillary: 230 mg/dL — ABNORMAL HIGH (ref 70–99)
Glucose-Capillary: 232 mg/dL — ABNORMAL HIGH (ref 70–99)
Glucose-Capillary: 204 mg/dL — ABNORMAL HIGH (ref 70–99)

## 2021-10-15 LAB — CBC WITH DIFFERENTIAL/PLATELET
Abs Immature Granulocytes: 0.08 10*3/uL — ABNORMAL HIGH (ref 0.00–0.07)
Basophils Absolute: 0.1 10*3/uL (ref 0.0–0.1)
Basophils Relative: 1 %
Eosinophils Absolute: 0 10*3/uL (ref 0.0–0.5)
Eosinophils Relative: 0 %
HCT: 36.4 % — ABNORMAL LOW (ref 39.0–52.0)
Hemoglobin: 12.4 g/dL — ABNORMAL LOW (ref 13.0–17.0)
Immature Granulocytes: 1 %
Lymphocytes Relative: 10 %
Lymphs Abs: 1.3 10*3/uL (ref 0.7–4.0)
MCH: 30.8 pg (ref 26.0–34.0)
MCHC: 34.1 g/dL (ref 30.0–36.0)
MCV: 90.5 fL (ref 80.0–100.0)
Monocytes Absolute: 0.9 10*3/uL (ref 0.1–1.0)
Monocytes Relative: 7 %
Neutro Abs: 10.3 10*3/uL — ABNORMAL HIGH (ref 1.7–7.7)
Neutrophils Relative %: 81 %
Platelets: 163 10*3/uL (ref 150–400)
RBC: 4.02 MIL/uL — ABNORMAL LOW (ref 4.22–5.81)
RDW: 15.9 % — ABNORMAL HIGH (ref 11.5–15.5)
WBC: 12.7 10*3/uL — ABNORMAL HIGH (ref 4.0–10.5)
nRBC: 0 % (ref 0.0–0.2)

## 2021-10-15 LAB — MAGNESIUM: Magnesium: 2.1 mg/dL (ref 1.7–2.4)

## 2021-10-15 MED ORDER — INSULIN GLARGINE-YFGN 100 UNIT/ML ~~LOC~~ SOLN
35.0000 [IU] | Freq: Every day | SUBCUTANEOUS | Status: DC
Start: 2021-10-15 — End: 2021-10-18
  Administered 2021-10-15 – 2021-10-17 (×3): 35 [IU] via SUBCUTANEOUS
  Filled 2021-10-15 (×4): qty 0.35

## 2021-10-15 MED ORDER — INSULIN ASPART 100 UNIT/ML IJ SOLN
0.0000 [IU] | INTRAMUSCULAR | Status: DC
  Administered 2021-10-15 (×2): 7 [IU] via SUBCUTANEOUS
  Administered 2021-10-16 (×2): 4 [IU] via SUBCUTANEOUS
  Administered 2021-10-16 (×3): 3 [IU] via SUBCUTANEOUS
  Administered 2021-10-17: 4 [IU] via SUBCUTANEOUS
  Administered 2021-10-17 – 2021-10-18 (×5): 3 [IU] via SUBCUTANEOUS
  Administered 2021-10-18 – 2021-10-19 (×2): 4 [IU] via SUBCUTANEOUS
  Administered 2021-10-19: 3 [IU] via SUBCUTANEOUS

## 2021-10-15 MED ORDER — BISACODYL 10 MG RE SUPP
10.0000 mg | Freq: Every day | RECTAL | Status: DC
  Administered 2021-10-15 – 2021-10-17 (×3): 10 mg via RECTAL
  Filled 2021-10-15 (×5): qty 1

## 2021-10-15 NOTE — Progress Notes (Signed)
Progress Note    Alfred Meyers   BWG:[id number]  DOB: September 05, 1945  DOA: 10/12/2021     3 PCP: [facility], [facility]  Initial CC: fall at home  Hospital Course: [name] is a 76 yo male with PMH PAF, DM II, HTN, HLD, MGUS, morbid obesity, CKD 3B who presented to the hospital after a fall at home. Patient was trying to clean up the house some recently as his wife was planning on returning home and the care of hospice.  She has currently been hospitalized for the past 3 days and he has been trying to clean up anticipating her return. He states that he was sitting in his recliner and fell backwards.  He then tried to stand up and walk approximately 10 feet with his walker then fell again.  He is not fully sure how he fell but does state that he felt slightly dizzy and also may have tripped resulting in the fall.  He hit his face upon falling and his left foot.  This occurred approximately 10 AM this morning and patient had already been up for several hours.  He has been eating approximately 2 meals per day but not drinking much fluid while his wife has been hospitalized. He also states he had an appointment at the [facility] in [place] today which he was preparing to go to prior to falling.  He says he recently had an MRI of his left shoulder after a prior fall resulting in pain with that and was going for his follow-up appointment for MRI results.  He is unsure what the MRI showed.  Multiple imaging studies were performed in the ER due to his traumatic fall.  Work-up was essentially negative except for left foot x-ray which showed mildly displaced left 2 through 5 metatarsal head fractures with intra-articular extension at the fifth MTP joint.  Orthopedic surgery was consulted and he was sent for a CT left foot for further evaluation of the fractures as well.  This further showed displaced fracture of the distal fibula just above the lateral malleolus and a distal comminuted intra-articular fracture of  the tibial plafond involving the medial and posterior malleoli.  He was placed in a cam walker boot and recommended for nonweightbearing of the left lower extremity.  Notable labs included creatinine 2.13, BUN 29.  Lactic acid 3.6.  Troponins were negative x2.  Due to inability to walk, pain control, AKI, and insufficient help at home he is admitted for IVF, pain management, PT eval.   Interval History:  Still having high output from NG tube, 1.9 L since yesterday.  He denies any nausea or vomiting after NG tube placement and does not have any significant abdominal pain.  He endorses minimal flatus and no bowel movement. Called to update daughter this afternoon; she was very upset regarding her mom and proceeded to yell at me on the phone mostly over her dismay with her perceived treatment and knowledge of essentially "all of the hospital and staff".  She was upset her mom was placed on comfort care and she appears to not understand what that means (or may just even be in denial about it if previously explained to her).  She was also told her dad ([name]) was "eating" by the nurse when she called yesterday, despite him being NPO (although he wasn't made NPO until after breakfast time, but he was vomiting quite a bit in the morning anyways).  I did my best to give her a  medical update regarding her dad but she was consistently interrupting me to yell about her mom and I highly doubt she heard nor retained much of what I said to her.  She ended up hanging up on me before I could finish my last sentence.   Assessment and Plan: * Acute renal failure superimposed on stage 3b chronic kidney disease ([place]) - patient has history of CKD3b. Baseline creat ~ 1.5-1.6, eGFR 44 - patient presents with increase in creat >0.3 mg/dL above baseline, creat increase >1.5x baseline presumed to have occurred within past 7 days PTA - Lactate elevation suspected in setting of volume depletion/poor perfusion; low  suspicion for infection at this time -Creatinine 2.13 on admission; given his decreased intake at home, suspect volume depletion/prerenal -Creatinine improving with fluids  Nausea & vomiting - developed N/V overnight of 8/17 - CT A/P shows distended stomach with gas and fluid; he may be developing an ileus  - repeat CTH also done again on 8/18 to ensure so further complications associated with fall at home to cause vomiting (negative for acute abnormalities) - keep NPO for now; ice chips and clamping NGT for meds okay - continue NGT, still high output; not ready for clamp trial yet - continue IVF  Metatarsal fracture - s/p fall; sounds mechanical but cannot rule out some orthostasis that may have contributed - per CT left foot: "Displaced fracture of the distal fibula just above the lateral malleolus. Displaced comminuted intra-articular fracture of the tibial plafond involving the medial and posterior malleoli. Displaced fracture of the 2nd-4th metatarsal head and neck junction. Questionable fracture of the fifth metatarsal head and neck junction. - NWB LLE per ortho recommendations - resume Eliquis today - PT/OT consulted; plan is either home with HH (if daughter and son in law able to care for him) vs SNF  Goals of care, counseling/discussion - Patient appears to have had rapid decline at home prior to hospitalization while his wife has been hospitalized.  She is now being transitioned to inpatient hospice and may have an inpatient death. I am concerned he may continue to further decline in setting of this.  He remains full code; low threshold for consulting palliative care for more [place] discussions but we will see how the next few days ago -Patient was able to visit with his wife on 10/14/2021 with assistance of staff; greatly appreciated. If able to facilitate more visits while in the hospital then please do so  Fall at home, initial encounter - Suspected mixed component of deconditioning  resulting in mechanical fall but may have also had some orthostasis from poor intake - echo reassuring (EF 65-70%, no RWMA) - continue with PT/OT as able   Lactic acidosis-resolved as of 10/13/2021 - Lactate elevation suspected in setting of volume depletion/poor perfusion; low suspicion for infection at this time -Initial lactic acid 3.6; downtrended some with IVF; no further trending needed  Atrial fibrillation ([place]) - Presented hypotensive and responded to fluids - continue eliquis and Toprol (if able)  Normocytic anemia - Baseline hemoglobin around 11 to 12 g/dL - Currently at baseline - Monitor hemoglobin after fall as patient on anticoagulation at baseline  Hypothyroidism - TSH 0.77 - continue synthroid  Hypertension - Initially presented with hypotension, blood pressure has now stabilized  Sleep apnea - RT eval  Hearing loss - Hearing aid in place in left ear  Barrett's esophagus - no PPI noted   SSS (sick sinus syndrome) ([place]) - 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  Obesity, Class III, BMI 40-49.9 (morbid obesity) ([place]) - Complicates overall prognosis and care - Body mass index is 41.67 kg/m.   Old records reviewed in assessment of this patient  Antimicrobials:   DVT prophylaxis:  SCDs Start: 10/12/21 1823 apixaban (ELIQUIS) tablet 5 mg   Code Status:   Code Status: Full Code  Mobility Assessment (last 72 hours)     Mobility Assessment     Row Name 10/15/21 1425 10/15/21 0840 10/14/21 2230 10/14/21 0900 10/13/21 1800   Does patient have an order for bedrest or is patient medically unstable -- No - Continue assessment No - Continue assessment No - Continue  assessment No - Continue assessment   What is the highest level of mobility based on the progressive mobility assessment? Level 2 ([place]) - Balance while sitting on edge of bed and cannot stand Level 3 (Stands with assist) - Balance while standing  and cannot march in place Level 3 (Stands with assist) - Balance while standing  and cannot march in place Level 3 (Stands with assist) - Balance while standing  and cannot march in place Level 3 (Stands with assist) - Balance while standing  and cannot march in place   Is the above level different from baseline mobility prior to current illness? -- Yes - Recommend PT order Yes - Recommend PT order Yes - Recommend PT order Yes - Recommend PT order    [place] Name 10/13/21 1538 10/13/21 1525         What is the highest level of mobility based on the progressive mobility assessment? Level 3 (Stands with assist) - Balance while standing  and cannot march in place Level 3 (Stands with assist) - Balance while standing  and cannot march in place               Barriers to discharge: none Disposition Plan:  Probable SNF vs home with HH (if family able to care for him) Status is: Inpt  Objective: Blood pressure (!) 165/94, pulse 80, temperature 98.5 F (36.9 C), temperature source Oral, resp. rate 20, height 5' 9"  (1.753 m), weight 128 kg, SpO2 97 %.  Examination:  Physical Exam Constitutional:      Appearance: He is obese.     Comments: Chronically ill-appearing elderly gentleman lying in bed in no distress  HENT:     Head:     Comments: Forehead abrasion appreciated    Mouth/Throat:     Mouth: Mucous membranes are dry.  Eyes:     Extraocular Movements: Extraocular movements intact.  Cardiovascular:     Rate and Rhythm: Normal rate and regular rhythm.  Pulmonary:     Effort: Pulmonary effort is normal. No respiratory distress.     Breath sounds: Normal breath sounds. No wheezing.  Abdominal:     General: Bowel sounds are decreased. There  is no distension.     Palpations: Abdomen is soft.     Tenderness: There is no abdominal tenderness.  Musculoskeletal:     Cervical back: Normal range of motion and neck supple.     Comments: Chronic appearing bilateral lower extremity edema approximately 2-3+.  Decreased active and passive left shoulder ROM limited by  pain (bone crepitus appreciated during passive ROM).  Pain in plantar surface of left foot  Skin:    Comments: Chronic stasis changes in bilateral lower extremities  Neurological:     General: No focal deficit present.  Psychiatric:        Mood and Affect: Mood normal.        Behavior: Behavior normal.     Consultants:    Procedures:    Data Reviewed: Results for orders placed or performed during the hospital encounter of 10/12/21 (from the past 24 hour(s))  Glucose, capillary     Status: Abnormal   Collection Time: 10/14/21  4:34 PM  Result Value Ref Range   Glucose-Capillary 251 (H) 70 - 99 mg/dL  Glucose, capillary     Status: Abnormal   Collection Time: 10/14/21  8:43 PM  Result Value Ref Range   Glucose-Capillary 185 (H) 70 - 99 mg/dL  Basic metabolic panel     Status: Abnormal   Collection Time: 10/15/21  2:54 AM  Result Value Ref Range   Sodium 142 135 - 145 mmol/L   Potassium 3.6 3.5 - 5.1 mmol/L   Chloride 102 98 - 111 mmol/L   CO2 30 22 - 32 mmol/L   Glucose, Bld 226 (H) 70 - 99 mg/dL   BUN 23 8 - 23 mg/dL   Creatinine, Ser 1.64 (H) 0.61 - 1.24 mg/dL   Calcium 8.9 8.9 - 10.3 mg/dL   GFR, Estimated 43 (L) >60 mL/min   Anion gap 10 5 - 15  CBC with Differential/Platelet     Status: Abnormal   Collection Time: 10/15/21  2:54 AM  Result Value Ref Range   WBC 12.7 (H) 4.0 - 10.5 K/uL   RBC 4.02 (L) 4.22 - 5.81 MIL/uL   Hemoglobin 12.4 (L) 13.0 - 17.0 g/dL   HCT 36.4 (L) 39.0 - 52.0 %   MCV 90.5 80.0 - 100.0 fL   MCH 30.8 26.0 - 34.0 pg   MCHC 34.1 30.0 - 36.0 g/dL   RDW 15.9 (H) 11.5 - 15.5 %   Platelets 163 150 - 400 K/uL   nRBC 0.0 0.0 -  0.2 %   Neutrophils Relative % 81 %   Neutro Abs 10.3 (H) 1.7 - 7.7 K/uL   Lymphocytes Relative 10 %   Lymphs Abs 1.3 0.7 - 4.0 K/uL   Monocytes Relative 7 %   Monocytes Absolute 0.9 0.1 - 1.0 K/uL   Eosinophils Relative 0 %   Eosinophils Absolute 0.0 0.0 - 0.5 K/uL   Basophils Relative 1 %   Basophils Absolute 0.1 0.0 - 0.1 K/uL   Immature Granulocytes 1 %   Abs Immature Granulocytes 0.08 (H) 0.00 - 0.07 K/uL  Magnesium     Status: None   Collection Time: 10/15/21  2:54 AM  Result Value Ref Range   Magnesium 2.1 1.7 - 2.4 mg/dL  Glucose, capillary     Status: Abnormal   Collection Time: 10/15/21  7:21 AM  Result Value Ref Range   Glucose-Capillary 231 (H) 70 - 99 mg/dL  Glucose, capillary     Status: Abnormal   Collection Time: 10/15/21 11:40 AM  Result Value Ref Range   Glucose-Capillary 230 (H) 70 - 99 mg/dL    I have Reviewed nursing notes, Vitals, and Lab results since pt's last encounter. Pertinent lab results : see above I have ordered test including BMP, CBC, Mg I have reviewed the last note from staff over past 24 hours I have  discussed pt's care plan and test results with nursing staff, case manager   LOS: 3 days   Dwyane Dee, MD [facility] 10/15/2021, 2:50 PM

## 2021-10-15 NOTE — Progress Notes (Signed)
Opened patient chart by mistake to search for room number, please disregard

## 2021-10-15 NOTE — Progress Notes (Signed)
Physical Therapy Treatment Patient Details Name: Alfred Meyers MRN: [id number] DOB: 10-25-1945 Today's Date: 10/15/2021   History of Present Illness 76 yo male with PMH PAF, DM II, HTN, HLD, MGUS, morbid obesity, CKD 3B who presented to the hospital after a fall at home.  CT Foot: Displaced fracture of the distal fibula just above the lateral  malleolus; Displaced comminuted intra-articular fracture of the tibial plafond involving the medial and posterior malleoli; Displaced fracture of the 2nd-4th metatarsal head and neck  junction; Questionable fracture of the fifth metatarsal head and neck  junction.    PT Comments    Patient able to transfer from bed to recliner with min assist, however he does put some weight through LLE despite use of lateral scoot transfer. Patient reports daughter and son-in-law are at home all the time and that he has an IT trainer scooter that he will use to get around the house (does not need/want a wheelchair). Patient continues to make progress and if he is correct about family suppport, can go home with HHPT.  If family cannot provide up to min assist, will need to look at SNF.     Recommendations for follow up therapy are one component of a multi-disciplinary discharge planning process, led by the attending physician.  Recommendations may be updated based on patient status, additional functional criteria and insurance authorization.  Follow Up Recommendations  Home health PT (if family can assist, if not need to consider SNF)     Assistance Recommended at Discharge Frequent or constant Supervision/Assistance  Patient can return home with the following A little help with walking and/or transfers;A little help with bathing/dressing/bathroom;Assistance with cooking/housework;Help with stairs or ramp for entrance;Assist for transportation   Equipment Recommendations  Rolling walker (2 wheels);Other (comment) (pt already has electric scooter he uses inside home)     Recommendations for Other Services       Precautions / Restrictions Precautions Precautions: Fall Required Braces or Orthoses: Other Brace Other Brace: CAM Walker Restrictions Weight Bearing Restrictions: Yes LLE Weight Bearing: Non weight bearing     Mobility  Bed Mobility Overal bed mobility: Needs Assistance Bed Mobility: Supine to Sit     Supine to sit: Min assist, HOB elevated     General bed mobility comments: min assist to raise torso; pt then able to scoot hips forward to reach feet to floor    Transfers Overall transfer level: Needs assistance Equipment used: Rolling walker (2 wheels), 1 person hand held assist Transfers: Sit to/from Stand, Bed to chair/wheelchair/BSC            Lateral/Scoot Transfers: Min assist General transfer comment: Performed lateral scoot to his right with pt requiring min assist to raise hips and pivot last portion of transfer; from recliner attempted sit to stand wiht RW and pt unable to transition hands from arms of chair up to RW while maintaining NWB LLE    Ambulation/Gait               General Gait Details: unable   Stairs             Wheelchair Mobility    Modified Rankin (Stroke Patients Only)       Balance Overall balance assessment: Needs assistance Sitting-balance support: Bilateral upper extremity supported Sitting balance-Leahy Scale: Fair  Cognition Arousal/Alertness: Awake/alert Behavior During Therapy: WFL for tasks assessed/performed Overall Cognitive Status: No family/caregiver present to determine baseline cognitive functioning                                 General Comments: HOH        Exercises Other Exercises Other Exercises: chair pushup x 1    General Comments General comments (skin integrity, edema, etc.): Patient reports he will not need a wheelchair because hehas a scooter that he uses inside home.  Reports daughter and son-in-law live in adjoined house and are there all the time.      Pertinent Vitals/Pain Pain Assessment Pain Assessment: 0-10 Pain Score: 4  Pain Location: L foot Pain Descriptors / Indicators: Sore, Tender Pain Intervention(s): Limited activity within patient's tolerance, Monitored during session, Repositioned    Home Living                          Prior Function            PT Goals (current goals can now be found in the care plan section) Acute Rehab PT Goals Patient Stated Goal: to go home Time For Goal Achievement: 10/27/21 Potential to Achieve Goals: Good Progress towards PT goals: Progressing toward goals    Frequency    Min 5X/week      PT Plan Current plan remains appropriate    Co-evaluation              AM-PAC PT "6 Clicks" Mobility   Outcome Measure  Help needed turning from your back to your side while in a flat bed without using bedrails?: A Little Help needed moving from lying on your back to sitting on the side of a flat bed without using bedrails?: A Little Help needed moving to and from a bed to a chair (including a wheelchair)?: A Little Help needed standing up from a chair using your arms (e.g., wheelchair or bedside chair)?: A Little Help needed to walk in hospital room?: Total Help needed climbing 3-5 steps with a railing? : Total 6 Click Score: 14    End of Session Equipment Utilized During Treatment: Gait belt Activity Tolerance: Patient tolerated treatment well Patient left: with call bell/phone within reach;in chair;with chair alarm set (on stretcher in ED) Nurse Communication: Mobility status;Other (comment) (complications with taking pt via transport chair to visit his wife) PT Visit Diagnosis: Unsteadiness on feet (R26.81);Muscle weakness (generalized) (M62.81);Difficulty in walking, not elsewhere classified (R26.2)     Time: 2841-3244 PT Time Calculation (min) (ACUTE ONLY): 28 min  Charges:   $Therapeutic Activity: 23-37 mins                      Arby Barrette, PT Acute Rehabilitation Services  Office [phone]    Rexanne Mano 10/15/2021, 2:33 PM

## 2021-10-15 NOTE — Discharge Instructions (Signed)

## 2021-10-16 DIAGNOSIS — Z7189 Other specified counseling: Secondary | ICD-10-CM | POA: Diagnosis not present

## 2021-10-16 DIAGNOSIS — N179 Acute kidney failure, unspecified: Secondary | ICD-10-CM | POA: Diagnosis not present

## 2021-10-16 DIAGNOSIS — R112 Nausea with vomiting, unspecified: Secondary | ICD-10-CM | POA: Diagnosis not present

## 2021-10-16 DIAGNOSIS — N1832 Chronic kidney disease, stage 3b: Secondary | ICD-10-CM | POA: Diagnosis not present

## 2021-10-16 LAB — BASIC METABOLIC PANEL
Anion gap: 10 (ref 5–15)
BUN: 20 mg/dL (ref 8–23)
CO2: 28 mmol/L (ref 22–32)
Calcium: 8.6 mg/dL — ABNORMAL LOW (ref 8.9–10.3)
Chloride: 104 mmol/L (ref 98–111)
Creatinine, Ser: 1.45 mg/dL — ABNORMAL HIGH (ref 0.61–1.24)
GFR, Estimated: 49 mL/min — ABNORMAL LOW (ref >60)
Glucose, Bld: 176 mg/dL — ABNORMAL HIGH (ref 70–99)
Potassium: 3.1 mmol/L — ABNORMAL LOW (ref 3.5–5.1)
Sodium: 142 mmol/L (ref 135–145)

## 2021-10-16 LAB — GLUCOSE, CAPILLARY
Glucose-Capillary: 127 mg/dL — ABNORMAL HIGH (ref 70–99)
Glucose-Capillary: 127 mg/dL — ABNORMAL HIGH (ref 70–99)
Glucose-Capillary: 140 mg/dL — ABNORMAL HIGH (ref 70–99)
Glucose-Capillary: 156 mg/dL — ABNORMAL HIGH (ref 70–99)
Glucose-Capillary: 166 mg/dL — ABNORMAL HIGH (ref 70–99)
Glucose-Capillary: 83 mg/dL (ref 70–99)

## 2021-10-16 LAB — CBC WITH DIFFERENTIAL/PLATELET
Abs Immature Granulocytes: 0.07 10*3/uL (ref 0.00–0.07)
Basophils Absolute: 0.1 10*3/uL (ref 0.0–0.1)
Basophils Relative: 1 %
Eosinophils Absolute: 0.2 10*3/uL (ref 0.0–0.5)
Eosinophils Relative: 2 %
HCT: 37.4 % — ABNORMAL LOW (ref 39.0–52.0)
Hemoglobin: 12.7 g/dL — ABNORMAL LOW (ref 13.0–17.0)
Immature Granulocytes: 1 %
Lymphocytes Relative: 9 %
Lymphs Abs: 1.4 10*3/uL (ref 0.7–4.0)
MCH: 30.9 pg (ref 26.0–34.0)
MCHC: 34 g/dL (ref 30.0–36.0)
MCV: 91 fL (ref 80.0–100.0)
Monocytes Absolute: 1 10*3/uL (ref 0.1–1.0)
Monocytes Relative: 7 %
Neutro Abs: 11.9 10*3/uL — ABNORMAL HIGH (ref 1.7–7.7)
Neutrophils Relative %: 80 %
Platelets: 190 10*3/uL (ref 150–400)
RBC: 4.11 MIL/uL — ABNORMAL LOW (ref 4.22–5.81)
RDW: 15.9 % — ABNORMAL HIGH (ref 11.5–15.5)
WBC: 14.6 10*3/uL — ABNORMAL HIGH (ref 4.0–10.5)
nRBC: 0 % (ref 0.0–0.2)

## 2021-10-16 LAB — MAGNESIUM: Magnesium: 2 mg/dL (ref 1.7–2.4)

## 2021-10-16 NOTE — Progress Notes (Signed)
Progress Note    Alfred Meyers   WUJ:[id number]  DOB: [birth year]  DOA: 10/12/2021     4 PCP: [facility], [facility]  Initial CC: fall at home  Hospital Course: [name] is a 76 yo male with PMH PAF, DM II, HTN, HLD, MGUS, morbid obesity, CKD 3B who presented to the hospital after a fall at home. Patient was trying to clean up the house some recently as his wife was planning on returning home and the care of hospice.  She has currently been hospitalized for the past 3 days and he has been trying to clean up anticipating her return. He states that he was sitting in his recliner and fell backwards.  He then tried to stand up and walk approximately 10 feet with his walker then fell again.  He is not fully sure how he fell but does state that he felt slightly dizzy and also may have tripped resulting in the fall.  He hit his face upon falling and his left foot.  This occurred approximately 10 AM this morning and patient had already been up for several hours.  He has been eating approximately 2 meals per day but not drinking much fluid while his wife has been hospitalized. He also states he had an appointment at the [facility] in [place] today which he was preparing to go to prior to falling.  He says he recently had an MRI of his left shoulder after a prior fall resulting in pain with that and was going for his follow-up appointment for MRI results.  He is unsure what the MRI showed.  Multiple imaging studies were performed in the ER due to his traumatic fall.  Work-up was essentially negative except for left foot x-ray which showed mildly displaced left 2 through 5 metatarsal head fractures with intra-articular extension at the fifth MTP joint.  Orthopedic surgery was consulted and he was sent for a CT left foot for further evaluation of the fractures as well.  This further showed displaced fracture of the distal fibula just above the lateral malleolus and a distal comminuted intra-articular fracture of  the tibial plafond involving the medial and posterior malleoli.  He was placed in a cam walker boot and recommended for nonweightbearing of the left lower extremity.  Notable labs included creatinine 2.13, BUN 29.  Lactic acid 3.6.  Troponins were negative x2.  Due to inability to walk, pain control, AKI, and insufficient help at home he is admitted for IVF, pain management, PT eval.   Interval History:  No events overnight.  NG tube still putting out large volume.  Denies any abdominal pain or emesis.  Was a little nauseous this morning.  He does however continue to have hunger pains which is also reassuring.  He says he had a small bowel movement yesterday.  We will continue suppository today to see how he does. He was able to visit with his wife again yesterday and also spoke with his daughter.  His daughter has calmed down some since yesterday.  Assessment and Plan: * Acute renal failure superimposed on stage 3b chronic kidney disease ([place]) - patient has history of CKD3b. Baseline creat ~ 1.5-1.6, eGFR 44 - patient presents with increase in creat >0.3 mg/dL above baseline, creat increase >1.5x baseline presumed to have occurred within past 7 days PTA - Lactate elevation suspected in setting of volume depletion/poor perfusion; low suspicion for infection at this time -Creatinine 2.13 on admission; given his decreased intake at home, suspect  volume depletion/prerenal -Creatinine improving with fluids  Nausea & vomiting - developed N/V overnight of 8/17 - CT A/P shows distended stomach with gas and fluid; he may be developing an ileus  - repeat CTH also done again on 8/18 to ensure so further complications associated with fall at home to cause vomiting (negative for acute abnormalities) - keep NPO for now; ice chips and clamping NGT for meds okay - continue NGT, still high output; not ready for clamp trial yet - continue IVF  Metatarsal fracture - s/p fall; sounds mechanical but cannot  rule out some orthostasis that may have contributed - per CT left foot: "Displaced fracture of the distal fibula just above the lateral malleolus. Displaced comminuted intra-articular fracture of the tibial plafond involving the medial and posterior malleoli. Displaced fracture of the 2nd-4th metatarsal head and neck junction. Questionable fracture of the fifth metatarsal head and neck junction. - NWB LLE per ortho recommendations - resume Eliquis today - PT/OT consulted; plan is either home with HH (if daughter and son in law able to care for him) vs SNF  Goals of care, counseling/discussion - Patient appears to have had rapid decline at home prior to hospitalization while his wife has been hospitalized.  She is now being transitioned to inpatient hospice and may have an inpatient death. I am concerned he may continue to further decline in setting of this.  He remains full code; low threshold for consulting palliative care for more [place] discussions but we will see how the next few days ago -Patient has been able to visit with his wife some with assistance of staff; greatly appreciated. If able to facilitate more visits while in the hospital then please do so  Fall at home, initial encounter - Suspected mixed component of deconditioning resulting in mechanical fall but may have also had some orthostasis from poor intake - echo reassuring (EF 65-70%, no RWMA) - continue with PT/OT as able   Lactic acidosis-resolved as of 10/13/2021 - Lactate elevation suspected in setting of volume depletion/poor perfusion; low suspicion for infection at this time -Initial lactic acid 3.6; downtrended some with IVF; no further trending needed  Atrial fibrillation ([place]) - Presented hypotensive and responded to fluids - continue eliquis and Toprol (if able)  Normocytic anemia - Baseline hemoglobin around 11 to 12 g/dL - Currently at baseline - Monitor hemoglobin after fall as patient on anticoagulation at  baseline  Hypothyroidism - TSH 0.77 - continue synthroid  Hypertension - Initially presented with hypotension, blood pressure has now stabilized  Sleep apnea - RT eval  Hearing loss - Hearing aid in place in left ear  Barrett's esophagus - no PPI noted   SSS (sick sinus syndrome) ([place]) - 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  Obesity, Class III, BMI 40-49.9 (morbid obesity) ([place]) - Complicates overall prognosis and care - Body mass index is 41.67 kg/m.   Old records reviewed in assessment of this patient  Antimicrobials:   DVT prophylaxis:  SCDs Start: 10/12/21 1823 apixaban (ELIQUIS) tablet 5 mg   Code Status:   Code Status: Full Code  Mobility Assessment (last 72 hours)     Mobility Assessment     Row  Name 10/15/21 2330 10/15/21 1425 10/15/21 0840 10/14/21 2230 10/14/21 0900   Does patient have an order for bedrest or is patient medically unstable No - Continue assessment -- No - Continue assessment No - Continue assessment No - Continue assessment   What is the highest level of mobility based on the progressive mobility assessment? Level 2 ([place]) - Balance while sitting on edge of bed and cannot stand Level 2 (Chairfast) - Balance while sitting on edge of bed and cannot stand Level 3 (Stands with assist) - Balance while standing  and cannot march in place Level 3 (Stands with assist) - Balance while standing  and cannot march in place Level 3 (Stands with assist) - Balance while standing  and cannot march in place   Is the above level different from baseline mobility prior to current illness? Yes - Recommend PT order -- Yes - Recommend PT order Yes - Recommend PT order Yes -  Recommend PT order    [employer] Name 10/13/21 1800 10/13/21 1538 10/13/21 1525       Does patient have an order for bedrest or is patient medically unstable No - Continue assessment -- --     What is the highest level of mobility based on the progressive mobility assessment? Level 3 (Stands with assist) - Balance while standing  and cannot march in place Level 3 (Stands with assist) - Balance while standing  and cannot march in place Level 3 (Stands with assist) - Balance while standing  and cannot march in place     Is the above level different from baseline mobility prior to current illness? Yes - Recommend PT order -- --              Barriers to discharge: none Disposition Plan:  Probable SNF vs home with [place] (if family able to care for him) Status is: Inpt  Objective: Blood pressure (!) 153/81, pulse 83, temperature (!) 97.3 F (36.3 C), temperature source Oral, resp. rate 18, height 5' 9" (1.753 m), weight 128 kg, SpO2 99 %.  Examination:  Physical Exam Constitutional:      Appearance: He is obese.     Comments: Chronically ill-appearing elderly gentleman lying in bed in no distress  HENT:     Head:     Comments: Forehead abrasion appreciated    Mouth/Throat:     Mouth: Mucous membranes are dry.  Eyes:     Extraocular Movements: Extraocular movements intact.  Cardiovascular:     Rate and Rhythm: Normal rate and regular rhythm.  Pulmonary:     Effort: Pulmonary effort is normal. No respiratory distress.     Breath sounds: Normal breath sounds. No wheezing.  Abdominal:     General: Bowel sounds are decreased. There is no distension.     Palpations: Abdomen is soft.     Tenderness: There is no abdominal tenderness.  Musculoskeletal:     Cervical back: Normal range of motion and neck supple.     Comments: Chronic appearing bilateral lower extremity edema approximately 2-3+.  Decreased active and passive left shoulder ROM limited by pain (bone crepitus appreciated during passive  ROM).  Pain in plantar surface of left foot  Skin:    Comments: Chronic stasis changes in bilateral lower extremities  Neurological:     General: No focal deficit present.  Psychiatric:        Mood and Affect: Mood normal.        Behavior: Behavior normal.  Consultants:    Procedures:    Data Reviewed: Results for orders placed or performed during the hospital encounter of 10/12/21 (from the past 24 hour(s))  Glucose, capillary     Status: Abnormal   Collection Time: 10/15/21  4:43 PM  Result Value Ref Range   Glucose-Capillary 232 (H) 70 - 99 mg/dL  Glucose, capillary     Status: Abnormal   Collection Time: 10/15/21  8:15 PM  Result Value Ref Range   Glucose-Capillary 204 (H) 70 - 99 mg/dL  Glucose, capillary     Status: Abnormal   Collection Time: 10/16/21 12:02 AM  Result Value Ref Range   Glucose-Capillary 127 (H) 70 - 99 mg/dL  Glucose, capillary     Status: None   Collection Time: 10/16/21  4:33 AM  Result Value Ref Range   Glucose-Capillary 83 70 - 99 mg/dL  Glucose, capillary     Status: Abnormal   Collection Time: 10/16/21  7:31 AM  Result Value Ref Range   Glucose-Capillary 166 (H) 70 - 99 mg/dL  Basic metabolic panel     Status: Abnormal   Collection Time: 10/16/21 10:04 AM  Result Value Ref Range   Sodium 142 135 - 145 mmol/L   Potassium 3.1 (L) 3.5 - 5.1 mmol/L   Chloride 104 98 - 111 mmol/L   CO2 28 22 - 32 mmol/L   Glucose, Bld 176 (H) 70 - 99 mg/dL   BUN 20 8 - 23 mg/dL   Creatinine, Ser 1.45 (H) 0.61 - 1.24 mg/dL   Calcium 8.6 (L) 8.9 - 10.3 mg/dL   GFR, Estimated 49 (L) >60 mL/min   Anion gap 10 5 - 15  CBC with Differential/Platelet     Status: Abnormal   Collection Time: 10/16/21 10:04 AM  Result Value Ref Range   WBC 14.6 (H) 4.0 - 10.5 K/uL   RBC 4.11 (L) 4.22 - 5.81 MIL/uL   Hemoglobin 12.7 (L) 13.0 - 17.0 g/dL   HCT 37.4 (L) 39.0 - 52.0 %   MCV 91.0 80.0 - 100.0 fL   MCH 30.9 26.0 - 34.0 pg   MCHC 34.0 30.0 - 36.0 g/dL   RDW 15.9  (H) 11.5 - 15.5 %   Platelets 190 150 - 400 K/uL   nRBC 0.0 0.0 - 0.2 %   Neutrophils Relative % 80 %   Neutro Abs 11.9 (H) 1.7 - 7.7 K/uL   Lymphocytes Relative 9 %   Lymphs Abs 1.4 0.7 - 4.0 K/uL   Monocytes Relative 7 %   Monocytes Absolute 1.0 0.1 - 1.0 K/uL   Eosinophils Relative 2 %   Eosinophils Absolute 0.2 0.0 - 0.5 K/uL   Basophils Relative 1 %   Basophils Absolute 0.1 0.0 - 0.1 K/uL   Immature Granulocytes 1 %   Abs Immature Granulocytes 0.07 0.00 - 0.07 K/uL  Magnesium     Status: None   Collection Time: 10/16/21 10:04 AM  Result Value Ref Range   Magnesium 2.0 1.7 - 2.4 mg/dL  Glucose, capillary     Status: Abnormal   Collection Time: 10/16/21 11:47 AM  Result Value Ref Range   Glucose-Capillary 140 (H) 70 - 99 mg/dL    I have Reviewed nursing notes, Vitals, and Lab results since pt's last encounter. Pertinent lab results : see above I have ordered test including BMP, CBC, Mg I have reviewed the last note from staff over past 24 hours I have discussed pt's care plan and test results with nursing  staff, case manager   LOS: 4 days   Dwyane Dee, MD [facility] 10/16/2021, 2:47 PM

## 2021-10-16 NOTE — Progress Notes (Signed)
Pt transported to wife's room in [facility] accompanied by RN on O2'@2LNC'$ . NGT clamped during tranport and connected to LIWS upon arrival to wife's room. Pt daughter at bedside and educated on RN role in accompanying patient and how to request assistance for pt while he is visiting his wife. Pt and daughter confirmed understanding. Will continue to monitor this patient throughout this event.

## 2021-10-17 ENCOUNTER — Inpatient Hospital Stay (HOSPITAL_COMMUNITY): Payer: No Typology Code available for payment source

## 2021-10-17 DIAGNOSIS — Z7189 Other specified counseling: Secondary | ICD-10-CM | POA: Diagnosis not present

## 2021-10-17 DIAGNOSIS — N1832 Chronic kidney disease, stage 3b: Secondary | ICD-10-CM | POA: Diagnosis not present

## 2021-10-17 DIAGNOSIS — N179 Acute kidney failure, unspecified: Secondary | ICD-10-CM | POA: Diagnosis not present

## 2021-10-17 DIAGNOSIS — R112 Nausea with vomiting, unspecified: Secondary | ICD-10-CM | POA: Diagnosis not present

## 2021-10-17 LAB — GLUCOSE, CAPILLARY
Glucose-Capillary: 116 mg/dL — ABNORMAL HIGH (ref 70–99)
Glucose-Capillary: 119 mg/dL — ABNORMAL HIGH (ref 70–99)
Glucose-Capillary: 121 mg/dL — ABNORMAL HIGH (ref 70–99)
Glucose-Capillary: 123 mg/dL — ABNORMAL HIGH (ref 70–99)
Glucose-Capillary: 148 mg/dL — ABNORMAL HIGH (ref 70–99)
Glucose-Capillary: 154 mg/dL — ABNORMAL HIGH (ref 70–99)
Glucose-Capillary: 83 mg/dL (ref 70–99)

## 2021-10-17 LAB — CBC WITH DIFFERENTIAL/PLATELET
Abs Immature Granulocytes: 0.1 10*3/uL — ABNORMAL HIGH (ref 0.00–0.07)
Basophils Absolute: 0.1 10*3/uL (ref 0.0–0.1)
Basophils Relative: 1 %
Eosinophils Absolute: 0.2 10*3/uL (ref 0.0–0.5)
Eosinophils Relative: 2 %
HCT: 37.9 % — ABNORMAL LOW (ref 39.0–52.0)
Hemoglobin: 12.5 g/dL — ABNORMAL LOW (ref 13.0–17.0)
Immature Granulocytes: 1 %
Lymphocytes Relative: 11 %
Lymphs Abs: 1.6 10*3/uL (ref 0.7–4.0)
MCH: 31 pg (ref 26.0–34.0)
MCHC: 33 g/dL (ref 30.0–36.0)
MCV: 94 fL (ref 80.0–100.0)
Monocytes Absolute: 0.9 10*3/uL (ref 0.1–1.0)
Monocytes Relative: 6 %
Neutro Abs: 11.6 10*3/uL — ABNORMAL HIGH (ref 1.7–7.7)
Neutrophils Relative %: 79 %
Platelets: 231 10*3/uL (ref 150–400)
RBC: 4.03 MIL/uL — ABNORMAL LOW (ref 4.22–5.81)
RDW: 16.2 % — ABNORMAL HIGH (ref 11.5–15.5)
WBC: 14.6 10*3/uL — ABNORMAL HIGH (ref 4.0–10.5)
nRBC: 0 % (ref 0.0–0.2)

## 2021-10-17 LAB — BASIC METABOLIC PANEL
Anion gap: 8 (ref 5–15)
BUN: 21 mg/dL (ref 8–23)
CO2: 30 mmol/L (ref 22–32)
Calcium: 8.3 mg/dL — ABNORMAL LOW (ref 8.9–10.3)
Chloride: 106 mmol/L (ref 98–111)
Creatinine, Ser: 1.46 mg/dL — ABNORMAL HIGH (ref 0.61–1.24)
GFR, Estimated: 49 mL/min — ABNORMAL LOW (ref >60)
Glucose, Bld: 132 mg/dL — ABNORMAL HIGH (ref 70–99)
Potassium: 3.4 mmol/L — ABNORMAL LOW (ref 3.5–5.1)
Sodium: 144 mmol/L (ref 135–145)

## 2021-10-17 LAB — MAGNESIUM: Magnesium: 2.1 mg/dL (ref 1.7–2.4)

## 2021-10-17 NOTE — TOC Progression Note (Addendum)
Transition of Care [facility]) - Progression Note    Patient Details  Name: DETRAVION TESTER MRN: [id number] Date of Birth: September 17, 1945  [facility]) CM/SW Contact  Jacalyn Lefevre Edson Snowball, RN Phone Number: 10/17/2021, 8:42 AM  Clinical Narrative:     See previous TOC note. Patient active with Janeece Riggers  and DME orders already emailed to [place].    Patient's PCP is [employer] fax [phone] , update faxed .  [facility] social  worker is Shelda Pal [phone] ext [phone]   Expected Discharge Plan: [place] Barriers to Discharge: Continued Medical Work up  Expected Discharge Plan and Services Expected Discharge Plan: [place]   Discharge Planning Services: CM Consult Post Acute Care Choice: [place] arrangements for the past 2 months: Single Family Home                 DME Arranged: Wheelchair manual, Walker rolling         HH Arranged: PT, OT HH Agency: [place] Date [facility] Agency Contacted: 10/14/21 Time [place]: [id number] Representative spoke with at [place]: Sunday Spillers   Social Determinants of Health ([place]) Interventions    Readmission Risk Interventions     No data to display

## 2021-10-17 NOTE — Plan of Care (Signed)
  Problem: Fluid Volume: Goal: Ability to maintain a balanced intake and output will improve Outcome: Completed/Met   Problem: Metabolic: Goal: Ability to maintain appropriate glucose levels will improve Outcome: Completed/Met   Problem: Nutritional: Goal: Maintenance of adequate nutrition will improve Outcome: Completed/Met   Problem: Skin Integrity: Goal: Risk for impaired skin integrity will decrease Outcome: Completed/Met   Problem: Tissue Perfusion: Goal: Adequacy of tissue perfusion will improve Outcome: Completed/Met   Problem: Education: Goal: Knowledge of General Education information will improve Description: Including pain rating scale, medication(s)/side effects and non-pharmacologic comfort measures Outcome: Completed/Met   Problem: Clinical Measurements: Goal: Diagnostic test results will improve Outcome: Completed/Met Goal: Respiratory complications will improve Outcome: Completed/Met Goal: Cardiovascular complication will be avoided Outcome: Completed/Met

## 2021-10-17 NOTE — Progress Notes (Signed)
Occupational Therapy Treatment Patient Details Name: Alfred Meyers MRN: [id number] DOB: 1945/04/20 Today's Date: 10/17/2021   History of present illness 76 yo male with PMH PAF, DM II, HTN, HLD, MGUS, morbid obesity, CKD 3B who presented to the hospital after a fall at home.  CT Foot: Displaced fracture of the distal fibula just above the lateral  malleolus; Displaced comminuted intra-articular fracture of the tibial plafond involving the medial and posterior malleoli; Displaced fracture of the 2nd-4th metatarsal head and neck  junction; Questionable fracture of the fifth metatarsal head and neck  junction.   OT comments  Pt demonstrated ability to perform lateral scoot with sliding board with min guard assist and to stand from recliner with verbal cues for technique and +2 min assist. Pt completed grooming with set up, UB bathing with min assist and pericare with total assist in standing. He was not able to maintain NWB to take steps. VSS on RA.   Recommendations for follow up therapy are one component of a multi-disciplinary discharge planning process, led by the attending physician.  Recommendations may be updated based on patient status, additional functional criteria and insurance authorization.    Follow Up Recommendations  Home health OT    Assistance Recommended at Discharge Frequent or constant Supervision/Assistance  Patient can return home with the following  Assistance with cooking/housework;A lot of help with bathing/dressing/bathroom;A little help with walking and/or transfers;Assist for transportation;Help with stairs or ramp for entrance   Equipment Recommendations  Other (comment) (drop arm commode)    Recommendations for Other Services      Precautions / Restrictions Precautions Precautions: Fall Precaution Comments: NGT Required Braces or Orthoses: Other Brace Other Brace: CAM Walker Restrictions Weight Bearing Restrictions: Yes LLE Weight Bearing: Non weight  bearing       Mobility Bed Mobility Overal bed mobility: Needs Assistance Bed Mobility: Supine to Sit     Supine to sit: Supervision, HOB elevated     General bed mobility comments: Use of rail, supervision for lines.    Transfers Overall transfer level: Needs assistance Equipment used: Rolling walker (2 wheels), Sliding board Transfers: Sit to/from Stand, Bed to chair/wheelchair/BSC Sit to Stand: Min assist, +2 physical assistance          Lateral/Scoot Transfers: With slide board General transfer comment: Lateral scoot tx to left side for practice using slide board, assist for slideboard setup and cues for technique. Min guard assist for safety. Stood from chair x2 with cues for hand placement/technique, difficulty maintaining NWb LLE.     Balance Overall balance assessment: Needs assistance Sitting-balance support: Feet supported, No upper extremity supported Sitting balance-Leahy Scale: Fair       Standing balance-Leahy Scale: Poor Standing balance comment: Able to stand with BUE support for pericare, able to maintain NWB ins tanding mostly statically.                           ADL either performed or assessed with clinical judgement   ADL Overall ADL's : Needs assistance/impaired     Grooming: Wash/dry hands;Wash/dry face;Brushing hair;Sitting;Set up   Upper Body Bathing: Minimal assistance;Sitting       Upper Body Dressing : Minimal assistance;Sitting       Toilet Transfer: Transfer board;Min guard Toilet Transfer Details (indicate cue type and reason): simulated to drop arm chair Toileting- Clothing Manipulation and Hygiene: +2 for safety/equipment;Sit to/from stand;Total assistance Toileting - Clothing Manipulation Details (indicate cue type and reason): able  to stand with assistance for posterior pericare            Extremity/Trunk Assessment              Vision       Perception     Praxis      Cognition  Arousal/Alertness: Awake/alert Behavior During Therapy: WFL for tasks assessed/performed Overall Cognitive Status: Within Functional Limits for tasks assessed                                          Exercises      Shoulder Instructions       General Comments Sp02 remained >95% on RA throughout session so kept 76f 02.    Pertinent Vitals/ Pain       Pain Assessment Pain Assessment: Faces Faces Pain Scale: No hurt  Home Living                                          Prior Functioning/Environment              Frequency  Min 2X/week        Progress Toward Goals  OT Goals(current goals can now be found in the care plan section)  Progress towards OT goals: Progressing toward goals  Acute Rehab OT Goals OT Goal Formulation: With patient Time For Goal Achievement: 10/27/21 Potential to Achieve Goals: Good  Plan Discharge plan remains appropriate    Co-evaluation      Reason for Co-Treatment: For patient/therapist safety;To address functional/ADL transfers PT goals addressed during session: Mobility/safety with mobility;Balance        AM-PAC OT "6 Clicks" Daily Activity     Outcome Measure   Help from another person eating meals?: None Help from another person taking care of personal grooming?: None Help from another person toileting, which includes using toliet, bedpan, or urinal?: Total Help from another person bathing (including washing, rinsing, drying)?: A Lot Help from another person to put on and taking off regular upper body clothing?: A Little Help from another person to put on and taking off regular lower body clothing?: A Lot 6 Click Score: 16    End of Session Equipment Utilized During Treatment: Rolling walker (2 wheels);Other (comment) (sliding board)  OT Visit Diagnosis: Unsteadiness on feet (R26.81);History of falling (Z91.81)   Activity Tolerance Patient tolerated treatment well   Patient Left in  chair;with call bell/phone within reach;with chair alarm set   Nurse Communication Mobility status        Time: [id number] Time Calculation (min): 34 min  Charges: OT General Charges $OT Visit: 1 Visit OT Treatments $Self Care/Home Management : 8-22 mins JCleta Alberts OTR/L Acute Rehabilitation Services Office: [phone] MMalka So8/21/2023, 10:05 AM

## 2021-10-17 NOTE — Progress Notes (Signed)
Pt transported back to his room in [facility] by RN with no complications.

## 2021-10-17 NOTE — Progress Notes (Signed)
Progress Note    Alfred Meyers   QQP:[id number]  DOB: 12-Jun-1945  DOA: 10/12/2021     5 PCP: [facility], [facility]  Initial CC: fall at home  Hospital Course: Alfred Meyers is a 76 yo male with PMH PAF, DM II, HTN, HLD, MGUS, morbid obesity, CKD 3B who presented to the hospital after a fall at home. Patient was trying to clean up the house some recently as his wife was planning on returning home and the care of hospice.  She has currently been hospitalized for the past 3 days and he has been trying to clean up anticipating her return. He states that he was sitting in his recliner and fell backwards.  He then tried to stand up and walk approximately 10 feet with his walker then fell again.  He is not fully sure how he fell but does state that he felt slightly dizzy and also may have tripped resulting in the fall.  He hit his face upon falling and his left foot.  This occurred approximately 10 AM this morning and patient had already been up for several hours.  He has been eating approximately 2 meals per day but not drinking much fluid while his wife has been hospitalized. He also states he had an appointment at the [facility] in [place] today which he was preparing to go to prior to falling.  He says he recently had an MRI of his left shoulder after a prior fall resulting in pain with that and was going for his follow-up appointment for MRI results.  He is unsure what the MRI showed.  Multiple imaging studies were performed in the ER due to his traumatic fall.  Work-up was essentially negative except for left foot x-ray which showed mildly displaced left 2 through 5 metatarsal head fractures with intra-articular extension at the fifth MTP joint.  Orthopedic surgery was consulted and he was sent for a CT left foot for further evaluation of the fractures as well.  This further showed displaced fracture of the distal fibula just above the lateral malleolus and a distal comminuted intra-articular fracture of  the tibial plafond involving the medial and posterior malleoli.  He was placed in a cam walker boot and recommended for nonweightbearing of the left lower extremity.  Notable labs included creatinine 2.13, BUN 29.  Lactic acid 3.6.  Troponins were negative x2.  Due to inability to walk, pain control, AKI, and insufficient help at home he is admitted for IVF, pain management, PT eval.   Interval History:  No events overnight.  Clamp trial started this morning.  He continues to feel okay.  No nausea or vomiting.  Denies any abdominal pain.  Assessment and Plan: * Acute renal failure superimposed on stage 3b chronic kidney disease ([place]) - patient has history of CKD3b. Baseline creat ~ 1.5-1.6, eGFR 44 - patient presents with increase in creat >0.3 mg/dL above baseline, creat increase >1.5x baseline presumed to have occurred within past 7 days PTA - Lactate elevation suspected in setting of volume depletion/poor perfusion; low suspicion for infection at this time -Creatinine 2.13 on admission; given his decreased intake at home, suspect volume depletion/prerenal -Creatinine improving with fluids  Nausea & vomiting - developed N/V overnight of 8/17 - CT A/P shows distended stomach with gas and fluid; he may be developing an ileus  - repeat CTH also done again on 8/18 to ensure so further complications associated with fall at home to cause vomiting (negative for acute abnormalities) -Output  high but starting to decrease.  He is also having ongoing hunger pains - Clamp trial today for 6 hours, if tolerates, will pull tube and start clear liquids - continue IVF  Metatarsal fracture - s/p fall; sounds mechanical but cannot rule out some orthostasis that may have contributed - per CT left foot: "Displaced fracture of the distal fibula just above the lateral malleolus. Displaced comminuted intra-articular fracture of the tibial plafond involving the medial and posterior malleoli. Displaced fracture  of the 2nd-4th metatarsal head and neck junction. Questionable fracture of the fifth metatarsal head and neck junction. - NWB LLE per ortho recommendations - resume Eliquis  - PT/OT consulted; plan is either home with [facility] (if daughter and son in law able to care for him) vs SNF -Patient is wanting to go home with family if able  Goals of care, counseling/discussion - Patient appears to have had rapid decline at home prior to hospitalization while his wife has been hospitalized.  She is now being transitioned to inpatient hospice and may have an inpatient death. I am concerned he may continue to further decline in setting of this.  He remains full code; low threshold for consulting palliative care for more [place] discussions but we will see how the next few days ago -Patient has been able to visit with his wife some with assistance of staff; greatly appreciated. If able to facilitate more visits while in the hospital then please do so  Fall at home, initial encounter - Suspected mixed component of deconditioning resulting in mechanical fall but may have also had some orthostasis from poor intake - echo reassuring (EF 65-70%, no RWMA) - continue with PT/OT as able   Lactic acidosis-resolved as of 10/13/2021 - Lactate elevation suspected in setting of volume depletion/poor perfusion; low suspicion for infection at this time -Initial lactic acid 3.6; downtrended some with IVF; no further trending needed  Atrial fibrillation ([place]) - Presented hypotensive and responded to fluids - continue eliquis and Toprol (if able)  Normocytic anemia - Baseline hemoglobin around 11 to 12 g/dL - Currently at baseline - Monitor hemoglobin after fall as patient on anticoagulation at baseline  Diabetes mellitus type 2 in obese (HCC) - A1c 9.2 % - Continue SSI and CBG monitoring - adding back basal; will need adjustment once diet is reinitiated  Hypothyroidism - TSH 0.77 - continue synthroid  Hypertension -  Initially presented with hypotension, blood pressure has now stabilized  Sleep apnea - RT eval  Hearing loss - Hearing aid in place in left ear  Barrett's esophagus - no PPI noted   SSS (sick sinus syndrome) ([place]) - 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  Obesity, Class III, BMI 40-49.9 (morbid obesity) ([place]) - Complicates overall prognosis and care - Body mass index is 41.67 kg/m.   Old records reviewed in assessment of this patient  Antimicrobials:   DVT prophylaxis:  SCDs Start: 10/12/21 1823 apixaban (ELIQUIS) tablet 5 mg   Code Status:   Code Status: Full Code  Mobility Assessment (last 72 hours)     Mobility Assessment     Row Name 10/17/21 1000 10/17/21 0946 10/16/21 2000 10/15/21 2330 10/15/21 1425   Does patient have an order for bedrest or is patient medically unstable No - Continue assessment -- No - Continue assessment No - Continue assessment --  What is the highest level of mobility based on the progressive mobility assessment? Level 3 (Stands with assist) - Balance while standing  and cannot march in place Level 3 (Stands with assist) - Balance while standing  and cannot march in place Level 3 (Stands with assist) - Balance while standing  and cannot march in place Level 2 (Chair[place]) - Balance while sitting on edge of bed and cannot stand Level 2 (Chairfast) - Balance while sitting on edge of bed and cannot stand   Is the above level different from baseline mobility prior to current illness? Yes - Recommend PT order -- Yes - Recommend PT order Yes - Recommend PT order --    [place] Name 10/15/21 0840 10/14/21 2230         Does patient have an order for bedrest or is patient medically unstable No - Continue assessment No - Continue assessment      What is the highest level of  mobility based on the progressive mobility assessment? Level 3 (Stands with assist) - Balance while standing  and cannot march in place Level 3 (Stands with assist) - Balance while standing  and cannot march in place      Is the above level different from baseline mobility prior to current illness? Yes - Recommend PT order Yes - Recommend PT order               Barriers to discharge: none Disposition Plan:  Probable SNF vs home with [facility] (if family able to care for him) Status is: Inpt  Objective: Blood pressure 110/65, pulse 78, temperature 98.4 F (36.9 C), temperature source Oral, resp. rate 18, height $RemoveBe'5\' 9"'UupSlciAa$  (1.753 m), weight 128 kg, SpO2 95 %.  Examination:  Physical Exam Constitutional:      Appearance: He is obese.     Comments: Chronically ill-appearing elderly gentleman lying in bed in no distress  HENT:     Head:     Comments: Forehead abrasion appreciated    Mouth/Throat:     Mouth: Mucous membranes are dry.  Eyes:     Extraocular Movements: Extraocular movements intact.  Cardiovascular:     Rate and Rhythm: Normal rate and regular rhythm.  Pulmonary:     Effort: Pulmonary effort is normal. No respiratory distress.     Breath sounds: Normal breath sounds. No wheezing.  Abdominal:     General: There is no distension.     Palpations: Abdomen is soft.     Tenderness: There is no abdominal tenderness.     Comments: Bowel sounds are improving and still remain very mildly hypoactive  Musculoskeletal:     Cervical back: Normal range of motion and neck supple.     Comments: Chronic appearing bilateral lower extremity edema approximately 2-3+.  Decreased active and passive left shoulder ROM limited by pain (bone crepitus appreciated during passive ROM).  Pain in plantar surface of left foot (boot in place)  Skin:    Comments: Chronic stasis changes in bilateral lower extremities  Neurological:     General: No focal deficit present.  Psychiatric:        Mood and Affect:  Mood normal.        Behavior: Behavior normal.     Consultants:    Procedures:    Data Reviewed: Results for orders placed or performed during the hospital encounter of 10/12/21 (from the past 24 hour(s))  Glucose, capillary     Status: Abnormal   Collection Time: 10/16/21  4:49  PM  Result Value Ref Range   Glucose-Capillary 127 (H) 70 - 99 mg/dL  Glucose, capillary     Status: Abnormal   Collection Time: 10/16/21  8:51 PM  Result Value Ref Range   Glucose-Capillary 156 (H) 70 - 99 mg/dL  Glucose, capillary     Status: Abnormal   Collection Time: 10/17/21 12:15 AM  Result Value Ref Range   Glucose-Capillary 119 (H) 70 - 99 mg/dL  Glucose, capillary     Status: Abnormal   Collection Time: 10/17/21  4:28 AM  Result Value Ref Range   Glucose-Capillary 123 (H) 70 - 99 mg/dL  Basic metabolic panel     Status: Abnormal   Collection Time: 10/17/21  4:32 AM  Result Value Ref Range   Sodium 144 135 - 145 mmol/L   Potassium 3.4 (L) 3.5 - 5.1 mmol/L   Chloride 106 98 - 111 mmol/L   CO2 30 22 - 32 mmol/L   Glucose, Bld 132 (H) 70 - 99 mg/dL   BUN 21 8 - 23 mg/dL   Creatinine, Ser 1.46 (H) 0.61 - 1.24 mg/dL   Calcium 8.3 (L) 8.9 - 10.3 mg/dL   GFR, Estimated 49 (L) >60 mL/min   Anion gap 8 5 - 15  CBC with Differential/Platelet     Status: Abnormal   Collection Time: 10/17/21  4:32 AM  Result Value Ref Range   WBC 14.6 (H) 4.0 - 10.5 K/uL   RBC 4.03 (L) 4.22 - 5.81 MIL/uL   Hemoglobin 12.5 (L) 13.0 - 17.0 g/dL   HCT 37.9 (L) 39.0 - 52.0 %   MCV 94.0 80.0 - 100.0 fL   MCH 31.0 26.0 - 34.0 pg   MCHC 33.0 30.0 - 36.0 g/dL   RDW 16.2 (H) 11.5 - 15.5 %   Platelets 231 150 - 400 K/uL   nRBC 0.0 0.0 - 0.2 %   Neutrophils Relative % 79 %   Neutro Abs 11.6 (H) 1.7 - 7.7 K/uL   Lymphocytes Relative 11 %   Lymphs Abs 1.6 0.7 - 4.0 K/uL   Monocytes Relative 6 %   Monocytes Absolute 0.9 0.1 - 1.0 K/uL   Eosinophils Relative 2 %   Eosinophils Absolute 0.2 0.0 - 0.5 K/uL   Basophils  Relative 1 %   Basophils Absolute 0.1 0.0 - 0.1 K/uL   Immature Granulocytes 1 %   Abs Immature Granulocytes 0.10 (H) 0.00 - 0.07 K/uL  Magnesium     Status: None   Collection Time: 10/17/21  4:32 AM  Result Value Ref Range   Magnesium 2.1 1.7 - 2.4 mg/dL  Glucose, capillary     Status: Abnormal   Collection Time: 10/17/21  7:37 AM  Result Value Ref Range   Glucose-Capillary 116 (H) 70 - 99 mg/dL  Glucose, capillary     Status: Abnormal   Collection Time: 10/17/21 11:28 AM  Result Value Ref Range   Glucose-Capillary 148 (H) 70 - 99 mg/dL    I have Reviewed nursing notes, Vitals, and Lab results since pt's last encounter. Pertinent lab results : see above I have ordered test including BMP, CBC, Mg I have reviewed the last note from staff over past 24 hours I have discussed pt's care plan and test results with nursing staff, case manager   LOS: 5 days   Dwyane Dee, MD [facility] 10/17/2021, 2:29 PM

## 2021-10-17 NOTE — Progress Notes (Signed)
NG Tube removed per MD, patient on clear liquid diet now.  Not complaining of nausea or vomiting.   Aurther Loft, RN

## 2021-10-17 NOTE — Progress Notes (Signed)
Physical Therapy Treatment Patient Details Name: Alfred Meyers MRN: [id number] DOB: [birth year] Today's Date: 10/17/2021   History of Present Illness 76 yo male with PMH PAF, DM II, HTN, HLD, MGUS, morbid obesity, CKD 3B who presented to the hospital after a fall at home.  CT Foot: Displaced fracture of the distal fibula just above the lateral  malleolus; Displaced comminuted intra-articular fracture of the tibial plafond involving the medial and posterior malleoli; Displaced fracture of the 2nd-4th metatarsal head and neck  junction; Questionable fracture of the fifth metatarsal head and neck  junction.    PT Comments    Patient progressing well towards PT goals. Session focused on lateral scoot transfer using slide board, standing transfers and pre gait. Pt requires Min guard assist for lateral scoot transfer for setup/safety. Able to stand with Min A of 2 but difficulty maintaining NWB LLE during transition. Attempted to hop forward but unable to maintain NWB so deferred. Pt has scooter he will use in the home and has support from family. VSS on RA throughout. Will continue to follow and progress.    Recommendations for follow up therapy are one component of a multi-disciplinary discharge planning process, led by the attending physician.  Recommendations may be updated based on patient status, additional functional criteria and insurance authorization.  Follow Up Recommendations  Home health PT     Assistance Recommended at Discharge Frequent or constant Supervision/Assistance  Patient can return home with the following A little help with bathing/dressing/bathroom;Assistance with cooking/housework;Help with stairs or ramp for entrance;Assist for transportation;A lot of help with walking and/or transfers   Equipment Recommendations  Rolling walker (2 wheels);Other (comment)    Recommendations for Other Services       Precautions / Restrictions Precautions Precautions: Fall Precaution  Comments: NGT Required Braces or Orthoses: Other Brace Other Brace: CAM Walker Restrictions Weight Bearing Restrictions: Yes LLE Weight Bearing: Non weight bearing     Mobility  Bed Mobility Overal bed mobility: Needs Assistance Bed Mobility: Supine to Sit     Supine to sit: Supervision, HOB elevated     General bed mobility comments: Use of rail, supervision for lines.    Transfers Overall transfer level: Needs assistance Equipment used: Rolling walker (2 wheels), Sliding board Transfers: Sit to/from Stand, Bed to chair/wheelchair/BSC Sit to Stand: Min assist, +2 physical assistance          Lateral/Scoot Transfers: With slide board General transfer comment: Lateral scoot tx to left side for practice using slide board, assist for slideboard setup and cues for technique. Min guard assist for safety. Stood from chair x2 with cues for hand placement/technique, difficulty maintaining NWb LLE.    Ambulation/Gait Ambulation/Gait assistance: Min assist, +2 safety/equipment, +2 physical assistance Gait Distance (Feet): 1 Feet Assistive device: Rolling walker (2 wheels)         General Gait Details: Attempted to hop x1 but unable to maintain NWB so deferred   Stairs             Wheelchair Mobility    Modified Rankin (Stroke Patients Only)       Balance Overall balance assessment: Needs assistance Sitting-balance support: Feet supported, No upper extremity supported Sitting balance-Leahy Scale: Fair     Standing balance support: During functional activity, Reliant on assistive device for balance Standing balance-Leahy Scale: Poor Standing balance comment: Able to stand with BUE support for pericare, able to maintain NWB ins tanding mostly statically.  Cognition Arousal/Alertness: Awake/alert Behavior During Therapy: WFL for tasks assessed/performed Overall Cognitive Status: No family/caregiver present to determine  baseline cognitive functioning                                 General Comments: HOH, needs repetition at times        Exercises      General Comments General comments (skin integrity, edema, etc.): Sp02 remained >95% on RA throughout session so kept 66f 02.      Pertinent Vitals/Pain Pain Assessment Pain Assessment: Faces Faces Pain Scale: No hurt    Home Living                          Prior Function            PT Goals (current goals can now be found in the care plan section) Progress towards PT goals: Progressing toward goals    Frequency    Min 5X/week      PT Plan Current plan remains appropriate    Co-evaluation PT/OT/SLP Co-Evaluation/Treatment: Yes Reason for Co-Treatment: For patient/therapist safety;To address functional/ADL transfers PT goals addressed during session: Mobility/safety with mobility;Balance        AM-PAC PT "6 Clicks" Mobility   Outcome Measure  Help needed turning from your back to your side while in a flat bed without using bedrails?: A Little Help needed moving from lying on your back to sitting on the side of a flat bed without using bedrails?: A Little Help needed moving to and from a bed to a chair (including a wheelchair)?: A Little Help needed standing up from a chair using your arms (e.g., wheelchair or bedside chair)?: A Lot Help needed to walk in hospital room?: Total Help needed climbing 3-5 steps with a railing? : Total 6 Click Score: 13    End of Session Equipment Utilized During Treatment: Gait belt Activity Tolerance: Patient tolerated treatment well Patient left: in chair;with call bell/phone within reach;with chair alarm set Nurse Communication: Mobility status PT Visit Diagnosis: Unsteadiness on feet (R26.81);Muscle weakness (generalized) (M62.81);Difficulty in walking, not elsewhere classified (R26.2)     Time: [address]PT Time Calculation (min) (ACUTE ONLY): 33 min  Charges:   $Therapeutic Activity: 8-22 mins                     SMarisa Severin PT, DPT Acute Rehabilitation Services Secure chat preferred Office [phone]     SMarguarite ArbourA HSabra Heck8/21/2023, 9:47 AM

## 2021-10-18 DIAGNOSIS — N400 Enlarged prostate without lower urinary tract symptoms: Secondary | ICD-10-CM

## 2021-10-18 DIAGNOSIS — E785 Hyperlipidemia, unspecified: Secondary | ICD-10-CM

## 2021-10-18 DIAGNOSIS — E039 Hypothyroidism, unspecified: Secondary | ICD-10-CM

## 2021-10-18 DIAGNOSIS — N179 Acute kidney failure, unspecified: Secondary | ICD-10-CM | POA: Diagnosis not present

## 2021-10-18 DIAGNOSIS — I495 Sick sinus syndrome: Secondary | ICD-10-CM

## 2021-10-18 DIAGNOSIS — G4730 Sleep apnea, unspecified: Secondary | ICD-10-CM

## 2021-10-18 DIAGNOSIS — K2270 Barrett's esophagus without dysplasia: Secondary | ICD-10-CM

## 2021-10-18 DIAGNOSIS — E872 Acidosis, unspecified: Secondary | ICD-10-CM

## 2021-10-18 DIAGNOSIS — I4891 Unspecified atrial fibrillation: Secondary | ICD-10-CM | POA: Diagnosis not present

## 2021-10-18 DIAGNOSIS — H9190 Unspecified hearing loss, unspecified ear: Secondary | ICD-10-CM

## 2021-10-18 LAB — BASIC METABOLIC PANEL
Anion gap: 6 (ref 5–15)
BUN: 21 mg/dL (ref 8–23)
CO2: 25 mmol/L (ref 22–32)
Calcium: 7.7 mg/dL — ABNORMAL LOW (ref 8.9–10.3)
Chloride: 108 mmol/L (ref 98–111)
Creatinine, Ser: 1.29 mg/dL — ABNORMAL HIGH (ref 0.61–1.24)
GFR, Estimated: 57 mL/min — ABNORMAL LOW (ref >60)
Glucose, Bld: 120 mg/dL — ABNORMAL HIGH (ref 70–99)
Potassium: 2.9 mmol/L — ABNORMAL LOW (ref 3.5–5.1)
Sodium: 139 mmol/L (ref 135–145)

## 2021-10-18 LAB — CBC WITH DIFFERENTIAL/PLATELET
Abs Immature Granulocytes: 0.08 10*3/uL — ABNORMAL HIGH (ref 0.00–0.07)
Basophils Absolute: 0.1 10*3/uL (ref 0.0–0.1)
Basophils Relative: 1 %
Eosinophils Absolute: 0.2 10*3/uL (ref 0.0–0.5)
Eosinophils Relative: 2 %
HCT: 34.2 % — ABNORMAL LOW (ref 39.0–52.0)
Hemoglobin: 11.6 g/dL — ABNORMAL LOW (ref 13.0–17.0)
Immature Granulocytes: 1 %
Lymphocytes Relative: 11 %
Lymphs Abs: 1.4 10*3/uL (ref 0.7–4.0)
MCH: 31.1 pg (ref 26.0–34.0)
MCHC: 33.9 g/dL (ref 30.0–36.0)
MCV: 91.7 fL (ref 80.0–100.0)
Monocytes Absolute: 0.8 10*3/uL (ref 0.1–1.0)
Monocytes Relative: 6 %
Neutro Abs: 10.2 10*3/uL — ABNORMAL HIGH (ref 1.7–7.7)
Neutrophils Relative %: 79 %
Platelets: 204 10*3/uL (ref 150–400)
RBC: 3.73 MIL/uL — ABNORMAL LOW (ref 4.22–5.81)
RDW: 16.1 % — ABNORMAL HIGH (ref 11.5–15.5)
WBC: 12.7 10*3/uL — ABNORMAL HIGH (ref 4.0–10.5)
nRBC: 0 % (ref 0.0–0.2)

## 2021-10-18 LAB — MAGNESIUM: Magnesium: 2 mg/dL (ref 1.7–2.4)

## 2021-10-18 LAB — GLUCOSE, CAPILLARY
Glucose-Capillary: 132 mg/dL — ABNORMAL HIGH (ref 70–99)
Glucose-Capillary: 134 mg/dL — ABNORMAL HIGH (ref 70–99)
Glucose-Capillary: 137 mg/dL — ABNORMAL HIGH (ref 70–99)
Glucose-Capillary: 150 mg/dL — ABNORMAL HIGH (ref 70–99)
Glucose-Capillary: 191 mg/dL — ABNORMAL HIGH (ref 70–99)
Glucose-Capillary: 54 mg/dL — ABNORMAL LOW (ref 70–99)
Glucose-Capillary: 95 mg/dL (ref 70–99)

## 2021-10-18 MED ORDER — DEXTROSE 50 % IV SOLN
INTRAVENOUS | Status: AC
  Administered 2021-10-18: 25 g via INTRAVENOUS
  Filled 2021-10-18: qty 50

## 2021-10-18 MED ORDER — INSULIN GLARGINE-YFGN 100 UNIT/ML ~~LOC~~ SOLN
20.0000 [IU] | Freq: Every day | SUBCUTANEOUS | Status: DC
  Administered 2021-10-18 – 2021-10-19 (×2): 20 [IU] via SUBCUTANEOUS
  Filled 2021-10-18 (×2): qty 0.2

## 2021-10-18 MED ORDER — POTASSIUM CHLORIDE 10 MEQ/100ML IV SOLN
10.0000 meq | INTRAVENOUS | Status: AC
  Administered 2021-10-18 (×5): 10 meq via INTRAVENOUS
  Filled 2021-10-18 (×5): qty 100

## 2021-10-18 MED ORDER — LACTATED RINGERS IV SOLN: INTRAVENOUS | Status: DC

## 2021-10-18 MED ORDER — DEXTROSE 50 % IV SOLN: 25.0000 g | INTRAVENOUS | Status: AC

## 2021-10-18 NOTE — Progress Notes (Signed)
Hypoglycemic Event  CBG:   Latest Reference Range & Units 10/18/21 03:52  Glucose-Capillary 70 - 99 mg/dL 54 (L)  (L): Data is abnormally low  Treatment: D50 50 mL (25 gm)  Symptoms: None  Follow-up CBG: Time:  CBG Result:  Latest Reference Range & Units 10/18/21 04:23  Glucose-Capillary 70 - 99 mg/dL 134 (H)  (H): Data is abnormally high  Possible Reasons for Event: Inadequate meal intake  Comments/MD notified:    Viviano Simas

## 2021-10-18 NOTE — Progress Notes (Signed)
PROGRESS NOTE    Alfred Meyers  WHQ:[id number] DOB: 09/07/1945 DOA: 10/12/2021 PCP: [facility]   Chief Complaint  Patient presents with   Fall    Brief Narrative:  [name] is a 76 yo male with PMH PAF, DM II, HTN, HLD, MGUS, morbid obesity, CKD 3B who presented to the hospital after a fall at home. Patient was trying to clean up the house some recently as his wife was planning on returning home and the care of hospice.  She has currently been hospitalized for the past 3 days and he has been trying to clean up anticipating her return. He states that he was sitting in his recliner and fell backwards.  He then tried to stand up and walk approximately 10 feet with his walker then fell again.  He is not fully sure how he fell but does state that he felt slightly dizzy and also may have tripped resulting in the fall.  He hit his face upon falling and his left foot.  This occurred approximately 10 AM this morning and patient had already been up for several hours.  He has been eating approximately 2 meals per day but not drinking much fluid while his wife has been hospitalized. He also states he had an appointment at the [facility] in [place] today which he was preparing to go to prior to falling.  He says he recently had an MRI of his left shoulder after a prior fall resulting in pain with that and was going for his follow-up appointment for MRI results.  He is unsure what the MRI showed.   Multiple imaging studies were performed in the ER due to his traumatic fall.  Work-up was essentially negative except for left foot x-ray which showed mildly displaced left 2 through 5 metatarsal head fractures with intra-articular extension at the fifth MTP joint.  Orthopedic surgery was consulted and he was sent for a CT left foot for further evaluation of the fractures as well.  This further showed displaced fracture of the distal fibula just above the lateral malleolus and a distal comminuted  intra-articular fracture of the tibial plafond involving the medial and posterior malleoli.  He was placed in a cam walker boot and recommended for nonweightbearing of the left lower extremity.   Notable labs included creatinine 2.13, BUN 29.  Lactic acid 3.6.  Troponins were negative x2.   Due to inability to walk, pain control, AKI, and insufficient help at home he is admitted for IVF, pain management, PT eval.      Assessment & Plan:   Principal Problem:   Acute renal failure superimposed on stage 3b chronic kidney disease ([place]) Active Problems:   Metatarsal fracture   Nausea & vomiting   Fall at home, initial encounter   Goals of care, counseling/discussion   Atrial fibrillation ([place])   Diabetes mellitus type 2 in obese ([place])   Normocytic anemia   Hypertension   Hypothyroidism   Obesity, Class III, BMI 40-49.9 (morbid obesity) ([facility])   Dyslipidemia   BPH (benign prostatic hyperplasia)   SSS (sick sinus syndrome) (H[facility])   Barrett's esophagus   Hearing loss   Sleep apnea   Pressure injury of skin  #1 acute renal failure on CKD stage IIIb, POA -Patient with history of CKD stage IIIb baseline creatinine approximately 1.5-1.6. -Patient noted on presentation to have presented with an increasing creatinine > 0.'3mg'$ /dl above baseline. -Felt likely secondary to prerenal azotemia in the setting of volume depletion plus  poor perfusion. -Creatinine on admission was 2.13. -Renal function improved with hydration creatinine currently at 1.29.  2.  Hypokalemia -Magnesium level at 2.0. -KCl 10 mEq IV every hour x5 runs. -Repeat labs in the AM.  3.  Nausea and vomiting -Patient noted to have nausea and vomiting the evening of 10/13/2021. -CT abdomen and pelvis done with a distended stomach with gas and fluid concerned patient may have developed an ileus. -Repeat head CT done with no acute abnormalities as patient noted to have had a fall at home leading to emesis. -Patient was placed on  bowel rest, NG tube placed.  Patient underwent clamping trial which she tolerated.  NG tube removed.  Patient tolerating clears and having bowel movement. -Advance to full liquid diet, and subsequently soft diet in the morning. -Continue gentle hydration. -Supportive care.  4.  Metatarsal fracture -Secondary to mechanical fall however concerns for possible orthostasis. -CT left foot with displaced fracture of the distal tibia just above the lateral malleolus.  Displaced comminuted intra-articular fracture of the tibial plafond involving the medial and posterior malleoli.  Displaced fracture of the 2nd-4th metatarsal head and neck junction.  Questionable fracture of the fifth metatarsal head and neck junction. -Patient seen in consultation by orthopedics who are recommending conservative management at this time with nonweightbearing to left lower extremity. -Eliquis resumed. -PT OT followed patient and recommending home health at this time.  5.  Fall -Felt likely secondary to deconditioning resulting in mechanical fall also concern for possible orthostasis secondary to poor oral intake. -2D echo with normal EF, NWMA. -PT/OT.  6.  Atrial fibrillation -On admission patient noted to be hypotensive which responded to IV fluids. -Currently rate controlled on Toprol. -Anticoagulation with Eliquis.  7.  Anemia -H&H stable.  8.  Diabetes mellitus type 2 -Hemoglobin A1c 9.2.(10/12/2021). -CBG 95 this morning. -Continue current dose of Semglee, SSI.  9.  Hypothyroidism -See throughout.  10.  Hypertension -Patient noted to be hypotensive which responded to fluids. -Continue Toprol.  11.  Hearing loss -Hearing aid in left ear.  12.  Barrett's esophagus -Follow-up.  13.  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 -PPM in place. -Continue Toprol.  14.  BPH -Finasteride.  15.  Dyslipidemia -Statin on hold and likely resume on discharge.  16.  Obesity, class III -BMI 41.67  kg/m2  17. Goals of care, counseling/discussion - Patient appears to have had rapid decline at home prior to hospitalization while his wife has been hospitalized.  She is now being transitioned to inpatient hospice and may have an inpatient death. Concerned he may continue to further decline in setting of this.  He remains full code; low threshold for consulting palliative care for more [place] discussions but we will see how the next few days ago -Patient has been able to visit with his wife some with assistance of staff; greatly appreciated. If able to facilitate more visits while in the hospital then please do so    DVT prophylaxis: Eliquis Code Status: Full Family Communication: Updated patient, no family at bedside. Disposition: Home with home health  Status is: Inpatient Remains inpatient appropriate because: Severity of illness   Consultants:  Orthopedics: Dr. Doreatha Martin 10/13/2021  Procedures:  CT left foot 10/12/2021 CT abdomen and pelvis 10/14/2021 CT head without contrast 10/14/2021 CT angiogram chest abdomen and pelvis 10/12/2021 CT C-spine 10/12/2021 CT head/CT maxillofacial 10/12/2021 Chest x-rays 10/12/2021 Plain films left foot 10/12/2021 Plain films right knee 10/12/2021 Plain films of the pelvis 10/12/2021 2D  echo 10/13/2021  Antimicrobials:  Anti-infectives (From admission, onward)    None         Subjective: Sitting up in chair.  No chest pain.  No shortness of breath.  No abdominal pain.  Overall feeling better.  Passing flatus.  Having bowel movement.  No nausea or vomiting.  Tolerating clears.  Objective: Vitals:   10/17/21 2020 10/18/21 0426 10/18/21 0837 10/18/21 1059  BP: 133/75 121/65 (!) 125/59 109/64  Pulse: 69 70 70 72  Resp: '16 18 19   '$ Temp: 98.4 F (36.9 C) 97.8 F (36.6 C) 98 F (36.7 C)   TempSrc:  Oral Oral   SpO2: 96% 97% 92%   Weight:      Height:        Intake/Output Summary (Last 24 hours) at 10/18/2021 1214 Last data filed at 10/18/2021  1000 Gross per 24 hour  Intake 2875.44 ml  Output 850 ml  Net 2025.44 ml   Filed Weights   10/12/21 1137  Weight: 128 kg    Examination:  General exam: Appears calm and comfortable.  Abrasion noted on forehead. Respiratory system: Clear to auscultation. Respiratory effort normal. Cardiovascular system: S1 & S2 heard, RRR. No JVD, murmurs, rubs, gallops or clicks. No pedal edema. Gastrointestinal system: Abdomen is nondistended, soft and nontender. No organomegaly or masses felt. Normal bowel sounds heard. Central nervous system: Alert and oriented. No focal neurological deficits. Extremities: Bilateral chronic venous stasis changes noted.  Bilateral chronic lower extremity edema 1-2+.  Left lower extremity in boot.  Skin: No rashes, lesions or ulcers Psychiatry: Judgement and insight appear normal. Mood & affect appropriate.     Data Reviewed: I have personally reviewed following labs and imaging studies  CBC: Recent Labs  Lab 10/14/21 0458 10/15/21 0254 10/16/21 1004 10/17/21 0432 10/18/21 0453  WBC 13.9* 12.7* 14.6* 14.6* 12.7*  NEUTROABS 12.1* 10.3* 11.9* 11.6* 10.2*  HGB 12.6* 12.4* 12.7* 12.5* 11.6*  HCT 36.5* 36.4* 37.4* 37.9* 34.2*  MCV 90.1 90.5 91.0 94.0 91.7  PLT 175 163 190 231 956    Basic Metabolic Panel: Recent Labs  Lab 10/14/21 0458 10/15/21 0254 10/16/21 1004 10/17/21 0432 10/18/21 0453  NA 134* 142 142 144 139  K 4.3 3.6 3.1* 3.4* 2.9*  CL 101 102 104 106 108  CO2 '23 30 28 30 25  '$ GLUCOSE 283* 226* 176* 132* 120*  BUN '22 23 20 21 21  '$ CREATININE 1.66* 1.64* 1.45* 1.46* 1.29*  CALCIUM 8.6* 8.9 8.6* 8.3* 7.7*  MG 2.0 2.1 2.0 2.1 2.0    GFR: Estimated Creatinine Clearance: 62.5 mL/min (A) (by C-G formula based on SCr of 1.29 mg/dL (H)).  Liver Function Tests: Recent Labs  Lab 10/12/21 1145  AST 36  ALT 25  ALKPHOS 178*  BILITOT 0.9  PROT 6.2*  ALBUMIN 2.8*    CBG: Recent Labs  Lab 10/17/21 2343 10/18/21 0352 10/18/21 0423  10/18/21 0734 10/18/21 1157  GLUCAP 83 54* 134* 95 191*     Recent Results (from the past 240 hour(s))  Resp Panel by RT-PCR (Flu A&B, Covid) Anterior Nasal Swab     Status: None   Collection Time: 10/12/21 11:43 AM   Specimen: Anterior Nasal Swab  Result Value Ref Range Status   SARS Coronavirus 2 by RT PCR NEGATIVE NEGATIVE Final    Comment: (NOTE) SARS-CoV-2 target nucleic acids are NOT DETECTED.  The SARS-CoV-2 RNA is generally detectable in upper respiratory specimens during the acute phase of infection. The lowest concentration  of SARS-CoV-2 viral copies this assay can detect is 138 copies/mL. A negative result does not preclude SARS-Cov-2 infection and should not be used as the sole basis for treatment or other patient management decisions. A negative result may occur with  improper specimen collection/handling, submission of specimen other than nasopharyngeal swab, presence of viral mutation(s) within the areas targeted by this assay, and inadequate number of viral copies(<138 copies/mL). A negative result must be combined with clinical observations, patient history, and epidemiological information. The expected result is Negative.  Fact Sheet for Patients:  EntrepreneurPulse.com.au  Fact Sheet for Healthcare Providers:  IncredibleEmployment.be  This test is no t yet approved or cleared by the [place] FDA and  has been authorized for detection and/or diagnosis of SARS-CoV-2 by FDA under an Emergency Use Authorization (EUA). This EUA will remain  in effect (meaning this test can be used) for the duration of the COVID-19 declaration under Section 564(b)(1) of the Act, 21 U.S.C.section 360bbb-3(b)(1), unless the authorization is terminated  or revoked sooner.       Influenza A by PCR NEGATIVE NEGATIVE Final   Influenza B by PCR NEGATIVE NEGATIVE Final    Comment: (NOTE) The Xpert Xpress SARS-CoV-2/FLU/RSV plus assay is  intended as an aid in the diagnosis of influenza from Nasopharyngeal swab specimens and should not be used as a sole basis for treatment. Nasal washings and aspirates are unacceptable for Xpert Xpress SARS-CoV-2/FLU/RSV testing.  Fact Sheet for Patients: EntrepreneurPulse.com.au  Fact Sheet for Healthcare Providers: IncredibleEmployment.be  This test is not yet approved or cleared by the [place] FDA and has been authorized for detection and/or diagnosis of SARS-CoV-2 by FDA under an Emergency Use Authorization (EUA). This EUA will remain in effect (meaning this test can be used) for the duration of the COVID-19 declaration under Section 564(b)(1) of the Act, 21 U.S.C. section 360bbb-3(b)(1), unless the authorization is terminated or revoked.  Performed at [facility], [place] [address], [place], [place] [address]          Radiology Studies: DG Abd Portable 1V  Result Date: 10/17/2021 CLINICAL DATA:  Nausea and vomiting EXAM: PORTABLE ABDOMEN - 1 VIEW COMPARISON:  10/15/2021 FINDINGS: Bowel gas pattern similar without evidence of obstruction. Moderate amount of air within small and large bowel, but with a normal pattern. Surgical clips as seen previously. IMPRESSION: No change. Bowel gas pattern within normal limits by plain radiography. Electronically Signed   By: Nelson Chimes M.D.   On: 10/17/2021 08:31        Scheduled Meds:  apixaban  5 mg Oral BID   bisacodyl  10 mg Rectal Daily   citalopram  10 mg Oral Daily   finasteride  5 mg Oral Daily   gabapentin  400 mg Oral BID   insulin aspart  0-20 Units Subcutaneous Q4H   insulin glargine-yfgn  20 Units Subcutaneous Daily   levothyroxine  100 mcg Oral Q0600   memantine  10 mg Oral BID   metoprolol succinate  25 mg Oral Daily   sodium chloride flush  3 mL Intravenous Q12H   Continuous Infusions:  lactated ringers 75 mL/hr at 10/18/21 1056   potassium chloride 10 mEq  (10/18/21 1114)     LOS: 6 days    Time spent: 40 minutes    Irine Seal, MD [facility]   To contact the attending provider between 7A-7P or the covering provider during after hours 7P-7A, please log into the web site [email or web address] and access using  universal [place] password for that web site. If you do not have the password, please call the hospital operator.  10/18/2021, 12:14 PM

## 2021-10-18 NOTE — Progress Notes (Signed)
Physical Therapy Treatment Patient Details Name: Alfred Meyers MRN: [id number] DOB: February 08, 1946 Today's Date: 10/18/2021   History of Present Illness 76 yo male with PMH PAF, DM II, HTN, HLD, MGUS, morbid obesity, CKD 3B who presented to the hospital after a fall at home.  CT Foot: Displaced fracture of the distal fibula just above the lateral  malleolus; Displaced comminuted intra-articular fracture of the tibial plafond involving the medial and posterior malleoli; Displaced fracture of the 2nd-4th metatarsal head and neck  junction; Questionable fracture of the fifth metatarsal head and neck  junction.    PT Comments    Patient seen for LE exercises and assist from bed to chair. Patient able to maintain NWB LLE when using sliding board for transfer. RN made aware that Mobility Team will help patient back to bed in ~2 hours.    Recommendations for follow up therapy are one component of a multi-disciplinary discharge planning process, led by the attending physician.  Recommendations may be updated based on patient status, additional functional criteria and insurance authorization.  Follow Up Recommendations  Home health PT     Assistance Recommended at Discharge Frequent or constant Supervision/Assistance  Patient can return home with the following A little help with bathing/dressing/bathroom;Assistance with cooking/housework;Help with stairs or ramp for entrance;Assist for transportation;A lot of help with walking and/or transfers   Equipment Recommendations  Rolling walker (2 wheels);Other (comment) (sliding board)    Recommendations for Other Services       Precautions / Restrictions Precautions Precautions: Fall Required Braces or Orthoses: Other Brace Other Brace: CAM Walker Restrictions Weight Bearing Restrictions: Yes LLE Weight Bearing: Non weight bearing     Mobility  Bed Mobility Overal bed mobility: Needs Assistance Bed Mobility: Supine to Sit     Supine to sit:  Supervision, HOB elevated     General bed mobility comments: supervision for lines; no use of rail    Transfers Overall transfer level: Needs assistance Equipment used: Sliding board Transfers: Bed to chair/wheelchair/BSC            Lateral/Scoot Transfers: With slide board, Min guard General transfer comment: Lateral scoot tx to left side for practice using slide board, assist for slideboard setup and cues for technique. Min guard assist for safety.    Ambulation/Gait                   Stairs             Wheelchair Mobility    Modified Rankin (Stroke Patients Only)       Balance Overall balance assessment: Needs assistance Sitting-balance support: Feet supported, No upper extremity supported Sitting balance-Leahy Scale: Good Sitting balance - Comments: able to laterally lean for pad and board placement                                    Cognition Arousal/Alertness: Awake/alert Behavior During Therapy: WFL for tasks assessed/performed Overall Cognitive Status: Within Functional Limits for tasks assessed                                 General Comments: HOH, needs repetition at times        Exercises General Exercises - Lower Extremity Long Arc Quad: AROM, Both, 10 reps Heel Slides: AROM, Right, 10 reps Straight Leg Raises: AROM, Left, 10 reps  General Comments        Pertinent Vitals/Pain Pain Assessment Pain Assessment: No/denies pain    Home Living                          Prior Function            PT Goals (current goals can now be found in the care plan section) Acute Rehab PT Goals Patient Stated Goal: to go home Time For Goal Achievement: 10/27/21 Potential to Achieve Goals: Good Progress towards PT goals: Progressing toward goals    Frequency    Min 5X/week      PT Plan Current plan remains appropriate    Co-evaluation              AM-PAC PT "6 Clicks" Mobility    Outcome Measure  Help needed turning from your back to your side while in a flat bed without using bedrails?: None Help needed moving from lying on your back to sitting on the side of a flat bed without using bedrails?: A Little Help needed moving to and from a bed to a chair (including a wheelchair)?: A Little Help needed standing up from a chair using your arms (e.g., wheelchair or bedside chair)?: Total Help needed to walk in hospital room?: Total Help needed climbing 3-5 steps with a railing? : Total 6 Click Score: 13    End of Session   Activity Tolerance: Patient tolerated treatment well Patient left: in chair;with call bell/phone within reach Nurse Communication: Mobility status;Other (comment) (mobility tech to put him back to bed in ~2 hours) PT Visit Diagnosis: Unsteadiness on feet (R26.81);Muscle weakness (generalized) (M62.81);Difficulty in walking, not elsewhere classified (R26.2)     Time: 6195-0932 PT Time Calculation (min) (ACUTE ONLY): 20 min  Charges:  $Therapeutic Exercise: 8-22 mins                      Arby Barrette, PT Acute Rehabilitation Services  Office [phone]    Rexanne Mano 10/18/2021, 10:25 AM

## 2021-10-19 ENCOUNTER — Inpatient Hospital Stay (HOSPITAL_COMMUNITY): Payer: No Typology Code available for payment source

## 2021-10-19 DIAGNOSIS — N1832 Chronic kidney disease, stage 3b: Secondary | ICD-10-CM | POA: Diagnosis not present

## 2021-10-19 DIAGNOSIS — N179 Acute kidney failure, unspecified: Secondary | ICD-10-CM | POA: Diagnosis not present

## 2021-10-19 LAB — CBC WITH DIFFERENTIAL/PLATELET
Abs Immature Granulocytes: 0.13 10*3/uL — ABNORMAL HIGH (ref 0.00–0.07)
Basophils Absolute: 0.1 10*3/uL (ref 0.0–0.1)
Basophils Relative: 1 %
Eosinophils Absolute: 0.3 10*3/uL (ref 0.0–0.5)
Eosinophils Relative: 3 %
HCT: 34.7 % — ABNORMAL LOW (ref 39.0–52.0)
Hemoglobin: 11.8 g/dL — ABNORMAL LOW (ref 13.0–17.0)
Immature Granulocytes: 1 %
Lymphocytes Relative: 10 %
Lymphs Abs: 1.4 10*3/uL (ref 0.7–4.0)
MCH: 30.9 pg (ref 26.0–34.0)
MCHC: 34 g/dL (ref 30.0–36.0)
MCV: 90.8 fL (ref 80.0–100.0)
Monocytes Absolute: 0.7 10*3/uL (ref 0.1–1.0)
Monocytes Relative: 5 %
Neutro Abs: 11.1 10*3/uL — ABNORMAL HIGH (ref 1.7–7.7)
Neutrophils Relative %: 80 %
Platelets: 213 10*3/uL (ref 150–400)
RBC: 3.82 MIL/uL — ABNORMAL LOW (ref 4.22–5.81)
RDW: 16.5 % — ABNORMAL HIGH (ref 11.5–15.5)
WBC: 13.8 10*3/uL — ABNORMAL HIGH (ref 4.0–10.5)
nRBC: 0 % (ref 0.0–0.2)

## 2021-10-19 LAB — BASIC METABOLIC PANEL
Anion gap: 7 (ref 5–15)
BUN: 17 mg/dL (ref 8–23)
CO2: 25 mmol/L (ref 22–32)
Calcium: 8.2 mg/dL — ABNORMAL LOW (ref 8.9–10.3)
Chloride: 106 mmol/L (ref 98–111)
Creatinine, Ser: 1.27 mg/dL — ABNORMAL HIGH (ref 0.61–1.24)
GFR, Estimated: 58 mL/min — ABNORMAL LOW (ref >60)
Glucose, Bld: 94 mg/dL (ref 70–99)
Potassium: 3.4 mmol/L — ABNORMAL LOW (ref 3.5–5.1)
Sodium: 138 mmol/L (ref 135–145)

## 2021-10-19 LAB — MAGNESIUM: Magnesium: 1.9 mg/dL (ref 1.7–2.4)

## 2021-10-19 LAB — GLUCOSE, CAPILLARY
Glucose-Capillary: 83 mg/dL (ref 70–99)
Glucose-Capillary: 118 mg/dL — ABNORMAL HIGH (ref 70–99)
Glucose-Capillary: 183 mg/dL — ABNORMAL HIGH (ref 70–99)

## 2021-10-19 MED ORDER — BENZONATATE 100 MG PO CAPS: 100.0000 mg | ORAL_CAPSULE | Freq: Three times a day (TID) | ORAL | 0 refills | Status: AC

## 2021-10-19 MED ORDER — DM-GUAIFENESIN ER 30-600 MG PO TB12: 1.0000 | ORAL_TABLET | Freq: Two times a day (BID) | ORAL | 0 refills | Status: AC | PRN

## 2021-10-19 MED ORDER — PHENOL 1.4 % MT LIQD: 1.0000 | OROMUCOSAL | Status: DC | PRN

## 2021-10-19 MED ORDER — POTASSIUM CHLORIDE CRYS ER 20 MEQ PO TBCR
40.0000 meq | EXTENDED_RELEASE_TABLET | Freq: Once | ORAL | Status: AC
  Administered 2021-10-19: 40 meq via ORAL
  Filled 2021-10-19: qty 2

## 2021-10-19 MED ORDER — BISACODYL 10 MG RE SUPP: 10.0000 mg | Freq: Every day | RECTAL | 0 refills | Status: AC

## 2021-10-19 MED ORDER — DM-GUAIFENESIN ER 30-600 MG PO TB12
1.0000 | ORAL_TABLET | Freq: Two times a day (BID) | ORAL | Status: DC
  Administered 2021-10-19: 1 via ORAL
  Filled 2021-10-19: qty 1

## 2021-10-19 MED ORDER — POTASSIUM CHLORIDE ER 10 MEQ PO TBCR: 20.0000 meq | EXTENDED_RELEASE_TABLET | Freq: Every day | ORAL | 0 refills | Status: AC

## 2021-10-19 MED ORDER — OXYCODONE-ACETAMINOPHEN 5-325 MG PO TABS: 1.0000 | ORAL_TABLET | ORAL | 0 refills | Status: AC | PRN

## 2021-10-19 MED ORDER — MENTHOL 3 MG MT LOZG: 1.0000 | LOZENGE | OROMUCOSAL | Status: DC | PRN

## 2021-10-19 MED ORDER — INSULIN ASPART PROT & ASPART (70-30 MIX) 100 UNIT/ML PEN: 15.0000 [IU] | PEN_INJECTOR | Freq: Two times a day (BID) | SUBCUTANEOUS | 0 refills | Status: AC

## 2021-10-19 NOTE — Progress Notes (Signed)
Occupational Therapy Treatment Patient Details Name: Alfred Meyers MRN: [id number] DOB: 1945/12/25 Today's Date: 10/19/2021   History of present illness 76 yo male with PMH PAF, DM II, HTN, HLD, MGUS, morbid obesity, CKD 3B who presented to the hospital after a fall at home.  CT Foot: Displaced fracture of the distal fibula just above the lateral  malleolus; Displaced comminuted intra-articular fracture of the tibial plafond involving the medial and posterior malleoli; Displaced fracture of the 2nd-4th metatarsal head and neck  junction; Questionable fracture of the fifth metatarsal head and neck  junction.   OT comments  Pt reports sticking to bed pad. Supervision for bed mobility, min assist to stand for pericare, but pt unable to maintain NWB on L LE, returned to sitting and performed lateral scoot with min guard assist to chair. Pt educated in LB bathing and dressing leaning side to side in sitting.    Recommendations for follow up therapy are one component of a multi-disciplinary discharge planning process, led by the attending physician.  Recommendations may be updated based on patient status, additional functional criteria and insurance authorization.    Follow Up Recommendations  Home health OT    Assistance Recommended at Discharge Frequent or constant Supervision/Assistance  Patient can return home with the following  Assistance with cooking/housework;A lot of help with bathing/dressing/bathroom;A little help with walking and/or transfers;Assist for transportation;Help with stairs or ramp for entrance   Equipment Recommendations  Wheelchair (measurements OT);Wheelchair cushion (measurements OT);Other (comment) (drop arm commode, sliding board)    Recommendations for Other Services      Precautions / Restrictions Precautions Precautions: Fall Required Braces or Orthoses: Other Brace Other Brace: CAM Walker Restrictions Weight Bearing Restrictions: Yes LLE Weight Bearing: Non  weight bearing       Mobility Bed Mobility Overal bed mobility: Needs Assistance Bed Mobility: Supine to Sit     Supine to sit: Supervision, HOB elevated     General bed mobility comments: increased time, HOB up    Transfers Overall transfer level: Needs assistance Equipment used: Rolling walker (2 wheels) Transfers: Sit to/from Stand, Bed to chair/wheelchair/BSC Sit to Stand: Min assist, From elevated surface          Lateral/Scoot Transfers: With slide board, Min guard General transfer comment: stood from bed with RW, but difficulty maintaining NWB on L LE, returned to sitting and performed lateral scoot to drop arm recliner     Balance Overall balance assessment: Needs assistance   Sitting balance-Leahy Scale: Good       Standing balance-Leahy Scale: Poor Standing balance comment: Pt able to unload L LE in static standing, but not keep weight off with sit<>stand.                           ADL either performed or assessed with clinical judgement   ADL Overall ADL's : Needs assistance/impaired     Grooming: Wash/dry hands;Wash/dry face;Brushing hair;Sitting;Set up   Upper Body Bathing: Minimal assistance;Sitting       Upper Body Dressing : Set up;Sitting           Toileting- Clothing Manipulation and Hygiene: Total assistance;Sit to/from stand         General ADL Comments: Educated pt in doing pericare leaning side to side as he his unable to maintain NWB on L LE in standing.    Extremity/Trunk Assessment              Vision  Perception     Praxis      Cognition Arousal/Alertness: Awake/alert Behavior During Therapy: WFL for tasks assessed/performed Overall Cognitive Status: Within Functional Limits for tasks assessed                                          Exercises      Shoulder Instructions       General Comments      Pertinent Vitals/ Pain       Pain Assessment Pain Assessment:  No/denies pain  Home Living                                          Prior Functioning/Environment              Frequency  Min 2X/week        Progress Toward Goals  OT Goals(current goals can now be found in the care plan section)  Progress towards OT goals: Progressing toward goals  Acute Rehab OT Goals OT Goal Formulation: With patient Time For Goal Achievement: 10/27/21 Potential to Achieve Goals: Good  Plan Discharge plan remains appropriate    Co-evaluation                 AM-PAC OT "6 Clicks" Daily Activity     Outcome Measure   Help from another person eating meals?: None Help from another person taking care of personal grooming?: None Help from another person toileting, which includes using toliet, bedpan, or urinal?: A Little Help from another person bathing (including washing, rinsing, drying)?: A Lot Help from another person to put on and taking off regular upper body clothing?: A Little Help from another person to put on and taking off regular lower body clothing?: Total 6 Click Score: 17    End of Session Equipment Utilized During Treatment: Rolling walker (2 wheels);Gait belt  OT Visit Diagnosis: Unsteadiness on feet (R26.81);History of falling (Z91.81)   Activity Tolerance Patient tolerated treatment well   Patient Left in chair;with chair alarm set;with call bell/phone within reach   Nurse Communication          Time: 1749-4496 OT Time Calculation (min): 27 min  Charges: OT General Charges $OT Visit: 1 Visit OT Treatments $Self Care/Home Management : 8-22 mins  Cleta Alberts, OTR/L Acute Rehabilitation Services Office: [phone]  [name] 10/19/2021, 9:44 AM

## 2021-10-19 NOTE — Progress Notes (Signed)
Patient discharged to home, AV reviewed with the patient and then with his daughter via telephone. All questions answered. IV removed and telemetry box returned. AVS, and eliquis coupon placed in discharge packet and given to the patient. PTAR provided transportation.

## 2021-10-19 NOTE — TOC Transition Note (Signed)
Transition of Care [facility]) - CM/SW Discharge Note   Patient Details  Name: CONSUELO THAYNE MRN: [id number] Date of Birth: Aug 29, 1945  [facility] of [facility]) CM/SW Contact:  Tom-Johnson, Renea Ee, RN Phone Number: 10/19/2021, 1:13 PM   Clinical Narrative:     Patient is scheduled for discharge today. Home health referral with Jane[facility], will start services on Friday. Info on AVS. DME's ordered from [place], to be delivered to patient's home. PTAR scheduled for transportation at discharge. No further TOC needs noted.    Final next level of care: [place] Barriers to Discharge: Barriers Resolved   Patient Goals and CMS Choice Patient states their goals for this hospitalization and ongoing recovery are:: To return home CMS Medicare.gov Compare Post Acute Care list provided to:: Patient Choice offered to / list presented to : Patient  Discharge Placement                Patient to be transferred to facility by: PTAR      Discharge Plan and Services   Discharge Planning Services: CM Consult Post Acute Care Choice: Home Health          DME Arranged: Wheelchair manual, Walker rolling         HH Arranged: PT, OT [facility] Agency: [place] Date [place]: 10/14/21 Time [place]: [id number] Representative spoke with at [place]: Sunday Spillers  Social Determinants of Health ([place]) Interventions     Readmission Risk Interventions     No data to display

## 2021-10-19 NOTE — Progress Notes (Signed)
Physical Therapy Treatment Patient Details Name: Alfred Meyers MRN: [id number] DOB: [birth year] Today's Date: 10/19/2021   History of Present Illness 76 yo male with PMH PAF, DM II, HTN, HLD, MGUS, morbid obesity, CKD 3B who presented to the hospital after a fall at home.  CT Foot: Displaced fracture of the distal fibula just above the lateral  malleolus; Displaced comminuted intra-articular fracture of the tibial plafond involving the medial and posterior malleoli; Displaced fracture of the 2nd-4th metatarsal head and neck  junction; Questionable fracture of the fifth metatarsal head and neck  junction.    PT Comments    Patient up in chair after OT session earlier today. Reporting bottom very sore and ready to get back to bed. Performed seated exercises for LLE prior to transfer. Patient able to laterally scoot "uphill" from chair to bed on his right with min assist (armrest dropped out of the way). Patient eager to go home and feels he will be able to transfer to/from his scooter.     Recommendations for follow up therapy are one component of a multi-disciplinary discharge planning process, led by the attending physician.  Recommendations may be updated based on patient status, additional functional criteria and insurance authorization.  Follow Up Recommendations  Home health PT     Assistance Recommended at Discharge Frequent or constant Supervision/Assistance  Patient can return home with the following A little help with bathing/dressing/bathroom;Assistance with cooking/housework;Help with stairs or ramp for entrance;Assist for transportation;A lot of help with walking and/or transfers   Equipment Recommendations  Rolling walker (2 wheels);Other (comment) (sliding board)    Recommendations for Other Services       Precautions / Restrictions Precautions Precautions: Fall Required Braces or Orthoses: Other Brace Other Brace: CAM Walker Restrictions Weight Bearing Restrictions:  Yes LLE Weight Bearing: Non weight bearing     Mobility  Bed Mobility Overal bed mobility: Needs Assistance Bed Mobility: Sit to Supine       Sit to supine: Supervision   General bed mobility comments: supervision for safety as barely cleared LLE up onto bed and then on edge of bed and needed cues to center in the bed away from the edge    Transfers Overall transfer level: Needs assistance Equipment used: None Transfers: Bed to chair/wheelchair/BSC            Lateral/Scoot Transfers: Min assist General transfer comment: Lateral scoot tx to right side for return to bed due to reporting bottom sore sitting in chair (RN made aware); min assist to support LLE in NWB    Ambulation/Gait                   Stairs             Wheelchair Mobility    Modified Rankin (Stroke Patients Only)       Balance Overall balance assessment: Needs assistance Sitting-balance support: Feet supported, No upper extremity supported Sitting balance-Leahy Scale: Good                                      Cognition Arousal/Alertness: Awake/alert Behavior During Therapy: WFL for tasks assessed/performed Overall Cognitive Status: Within Functional Limits for tasks assessed  Exercises General Exercises - Lower Extremity Long Arc Quad: AROM, Both, 20 reps Hip Flexion/Marching: AROM, Left, 10 reps, Seated    General Comments        Pertinent Vitals/Pain Pain Assessment Pain Assessment: No/denies pain    Home Living                          Prior Function            PT Goals (current goals can now be found in the care plan section) Acute Rehab PT Goals Patient Stated Goal: to go home Time For Goal Achievement: 10/27/21 Potential to Achieve Goals: Good Progress towards PT goals: Progressing toward goals    Frequency    Min 5X/week      PT Plan Current plan remains  appropriate    Co-evaluation              AM-PAC PT "6 Clicks" Mobility   Outcome Measure  Help needed turning from your back to your side while in a flat bed without using bedrails?: None Help needed moving from lying on your back to sitting on the side of a flat bed without using bedrails?: A Little Help needed moving to and from a bed to a chair (including a wheelchair)?: A Little Help needed standing up from a chair using your arms (e.g., wheelchair or bedside chair)?: Total Help needed to walk in hospital room?: Total Help needed climbing 3-5 steps with a railing? : Total 6 Click Score: 13    End of Session   Activity Tolerance: Patient tolerated treatment well Patient left: with call bell/phone within reach;in bed;with bed alarm set Nurse Communication: Mobility status;Other (comment) (reports very sore bottom) PT Visit Diagnosis: Unsteadiness on feet (R26.81);Muscle weakness (generalized) (M62.81);Difficulty in walking, not elsewhere classified (R26.2)     Time: 2633-3545 PT Time Calculation (min) (ACUTE ONLY): 20 min  Charges:  $Therapeutic Activity: 8-22 mins                      Arby Barrette, PT Acute Rehabilitation Services  Office [phone]    Rexanne Mano 10/19/2021, 10:57 AM

## 2021-10-20 DIAGNOSIS — I4891 Unspecified atrial fibrillation: Secondary | ICD-10-CM | POA: Diagnosis not present

## 2021-10-20 DIAGNOSIS — I878 Other specified disorders of veins: Secondary | ICD-10-CM | POA: Diagnosis not present

## 2021-10-20 DIAGNOSIS — R21 Rash and other nonspecific skin eruption: Secondary | ICD-10-CM | POA: Diagnosis not present

## 2021-10-20 DIAGNOSIS — I10 Essential (primary) hypertension: Secondary | ICD-10-CM | POA: Diagnosis not present

## 2021-10-20 DIAGNOSIS — S99922S Unspecified injury of left foot, sequela: Secondary | ICD-10-CM | POA: Diagnosis not present

## 2021-10-20 DIAGNOSIS — R918 Other nonspecific abnormal finding of lung field: Secondary | ICD-10-CM | POA: Diagnosis not present

## 2021-10-20 DIAGNOSIS — R5383 Other fatigue: Secondary | ICD-10-CM | POA: Diagnosis not present

## 2021-10-20 DIAGNOSIS — I130 Hypertensive heart and chronic kidney disease with heart failure and stage 1 through stage 4 chronic kidney disease, or unspecified chronic kidney disease: Secondary | ICD-10-CM | POA: Diagnosis not present

## 2021-10-20 DIAGNOSIS — M79673 Pain in unspecified foot: Secondary | ICD-10-CM | POA: Diagnosis not present

## 2021-10-20 DIAGNOSIS — S82202A Unspecified fracture of shaft of left tibia, initial encounter for closed fracture: Secondary | ICD-10-CM | POA: Diagnosis not present

## 2021-10-20 DIAGNOSIS — R110 Nausea: Secondary | ICD-10-CM | POA: Diagnosis not present

## 2021-10-20 DIAGNOSIS — I5033 Acute on chronic diastolic (congestive) heart failure: Secondary | ICD-10-CM | POA: Diagnosis not present

## 2021-10-20 DIAGNOSIS — S82892D Other fracture of left lower leg, subsequent encounter for closed fracture with routine healing: Secondary | ICD-10-CM | POA: Diagnosis not present

## 2021-10-20 DIAGNOSIS — S92302A Fracture of unspecified metatarsal bone(s), left foot, initial encounter for closed fracture: Secondary | ICD-10-CM | POA: Diagnosis not present

## 2021-10-20 DIAGNOSIS — R059 Cough, unspecified: Secondary | ICD-10-CM | POA: Diagnosis not present

## 2021-10-20 DIAGNOSIS — S82435A Nondisplaced oblique fracture of shaft of left fibula, initial encounter for closed fracture: Secondary | ICD-10-CM | POA: Diagnosis not present

## 2021-10-20 DIAGNOSIS — S92902D Unspecified fracture of left foot, subsequent encounter for fracture with routine healing: Secondary | ICD-10-CM | POA: Diagnosis not present

## 2021-10-20 DIAGNOSIS — I509 Heart failure, unspecified: Secondary | ICD-10-CM | POA: Diagnosis not present

## 2021-10-20 DIAGNOSIS — M79672 Pain in left foot: Secondary | ICD-10-CM | POA: Diagnosis not present

## 2021-10-20 DIAGNOSIS — R630 Anorexia: Secondary | ICD-10-CM | POA: Diagnosis not present

## 2021-10-20 DIAGNOSIS — N1832 Chronic kidney disease, stage 3b: Secondary | ICD-10-CM | POA: Diagnosis not present

## 2021-10-20 DIAGNOSIS — R0981 Nasal congestion: Secondary | ICD-10-CM | POA: Diagnosis not present

## 2021-10-20 DIAGNOSIS — S82852A Displaced trimalleolar fracture of left lower leg, initial encounter for closed fracture: Secondary | ICD-10-CM | POA: Diagnosis not present

## 2021-10-20 DIAGNOSIS — J9811 Atelectasis: Secondary | ICD-10-CM | POA: Diagnosis not present

## 2021-10-20 DIAGNOSIS — I2510 Atherosclerotic heart disease of native coronary artery without angina pectoris: Secondary | ICD-10-CM | POA: Diagnosis not present

## 2021-10-21 NOTE — Discharge Summary (Signed)
Physician Discharge Summary   Patient: Alfred Meyers MRN: [id number] DOB: 1945-08-19  Admit date:     10/12/2021  Discharge date: 10/19/2021  Discharge Physician: Berle Mull  PCP: [facility], [facility]  Recommendations at discharge: Follow up with PCP in 1 week Follow up with Orthopedics as recommended    Follow-up Information     Clinic, [facility]. Schedule an appointment as soon as possible for a visit in 1 week(s).   Why: with CBC and BMP Contact information: [place] [address] [phone]         Shona Needles, MD. Schedule an appointment as soon as possible for a visit in 1 week(s).   Specialty: Orthopedic Surgery Why: for fracture follow up. Contact information: [place] [address] [phone]                Discharge Diagnoses: Principal Problem:   Acute renal failure superimposed on stage 3b chronic kidney disease ([place]) Active Problems:   Metatarsal fracture   Nausea & vomiting   Fall at home, initial encounter   Goals of care, counseling/discussion   Atrial fibrillation ([place])   Diabetes mellitus type 2 in obese (HCC)   Normocytic anemia   Hypertension   Hypothyroidism   Obesity, Class III, BMI 40-49.9 (morbid obesity) (HCC)   Dyslipidemia   BPH (benign prostatic hyperplasia)   SSS (sick sinus syndrome) (HCC)   Barrett's esophagus   Hearing loss   Sleep apnea   Pressure injury of skin  Hospital Course: [name] is a 76 yo male with PMH PAF, DM II, HTN, HLD, MGUS, morbid obesity, CKD 3B who presented to the hospital after a fall at home. Patient was trying to clean up the house some recently as his wife was planning on returning home and the care of hospice.  She has currently been hospitalized for the past 3 days and he has been trying to clean up anticipating her return. He states that he was sitting in his recliner and fell backwards.  He then tried to stand up and walk  approximately 10 feet with his walker then fell again.  He is not fully sure how he fell but does state that he felt slightly dizzy and also may have tripped resulting in the fall.  He hit his face upon falling and his left foot.  This occurred approximately 10 AM this morning and patient had already been up for several hours.  He has been eating approximately 2 meals per day but not drinking much fluid while his wife has been hospitalized. He also states he had an appointment at the [facility] in [place] today which he was preparing to go to prior to falling.  He says he recently had an MRI of his left shoulder after a prior fall resulting in pain with that and was going for his follow-up appointment for MRI results.  He is unsure what the MRI showed.  Multiple imaging studies were performed in the ER due to his traumatic fall.  Work-up was essentially negative except for left foot x-ray which showed mildly displaced left 2 through 5 metatarsal head fractures with intra-articular extension at the fifth MTP joint.  Orthopedic surgery was consulted and he was sent for a CT left foot for further evaluation of the fractures as well.  This further showed displaced fracture of the distal fibula just above the lateral malleolus and a distal comminuted intra-articular fracture of the tibial plafond involving the  medial and posterior malleoli.  He was placed in a cam walker boot and recommended for nonweightbearing of the left lower extremity.  Notable labs included creatinine 2.13, BUN 29.  Lactic acid 3.6.  Troponins were negative x2.  Due to inability to walk, pain control, AKI, and insufficient help at home he is admitted for IVF, pain management, PT eval.  Assessment and Plan  Acute renal failure on CKD stage IIIb, POA -Patient with history of CKD stage IIIb baseline creatinine approximately 1.5-1.6. -Patient noted on presentation to have presented with an increasing creatinine > 0.7m/dl above baseline. -Felt  likely secondary to prerenal azotemia in the setting of volume depletion plus poor perfusion. -Creatinine on admission was 2.13. -Renal function improved with hydration   Hypokalemia Replaced.  Nausea and vomiting Ileus -Patient noted to have nausea and vomiting the evening of 10/13/2021. -CT abdomen and pelvis done with a distended stomach with gas and fluid concerned patient may have developed an ileus. -Repeat head CT done with no acute abnormalities as patient noted to have had a fall at home leading to emesis. -Patient was placed on bowel rest, NG tube placed.  Patient underwent clamping trial which she tolerated.  NG tube removed.  Patient tolerating oral diet and having bowel movement. -Supportive care.   Multiple metatarsal fracture as well as tibia-fibula fracture -Secondary to mechanical fall however concerns for possible orthostasis. -CT left foot with displaced fracture of the distal tibia just above the lateral malleolus.  Displaced comminuted intra-articular fracture of the tibial plafond involving the medial and posterior malleoli.  Displaced fracture of the 2nd-4th metatarsal head and neck junction.  Questionable fracture of the fifth metatarsal head and neck junction. -Patient seen in consultation by orthopedics who are recommending conservative management at this time with nonweightbearing to left lower extremity. -Eliquis resumed. -PT OT followed patient and recommending home health at this time.   Mechanical fall -Felt likely secondary to deconditioning resulting in mechanical fall also concern for possible orthostasis secondary to poor oral intake. -2D echo with normal EF, NWMA. -PT/OT recommended SNF although patient wants to go home as he has a wife who is on hospice.  Discussed with patient's daughter who is going to provide care at home.   Paroxysmal atrial fibrillation -On admission patient noted to be hypotensive which responded to IV fluids. -Currently rate  controlled on Toprol. -Anticoagulation with Eliquis.   Normocytic anemia -H&H stable.  Outpatient work-up with PCP.   Diabetes mellitus type 2 uncontrolled with hyperglycemia and hypoglycemia without any complication -Hemoglobin A1c 9.2.(10/12/2021). Continue home regimen at discharge although at a reduced rate given his hypoglycemic episodes as well. Outpatient follow-up with PCP.  Hypothyroidism Continue Synthroid.  Hypertension Blood pressure stable. Initially was noted to be hypotensive which responded to fluids. -Continue Toprol.   Chronic hearing loss -Hearing aid in left ear.   Barrett's esophagus -Outpatient follow-up.   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 -PPM in place. -Continue Toprol.   BPH -Continue finasteride.   Dyslipidemia -Statin on hold and likely resume on discharge.   Obesity, class III Body mass index is 41.67 kg/m.  Placing the pt at higher risk of poor outcomes.  Goals of care, counseling/discussion - Patient appears to have had rapid decline at home prior to hospitalization while his wife has been been hospitalized.  She is now being transitioned to inpatient hospice and may have an inpatient death. Concerned he may continue to further decline in setting of this.  He remains full code;  Outpatient palliative care consultation would be ideal for the patient.  Pain control - [employer] Controlled Substance Reporting System database was reviewed. and patient was instructed, not to drive, operate heavy machinery, perform activities at heights, swimming or participation in water activities or provide baby-sitting services while on Pain, Sleep and Anxiety Medications; until their outpatient Physician has advised to do so again. Also recommended to not to take more than prescribed Pain, Sleep and Anxiety Medications.  Consultants:  Orthopedics   Procedures performed:  none  DISCHARGE MEDICATION: Allergies as of  10/19/2021   No Known Allergies      Medication List     STOP taking these medications    allopurinol 100 MG tablet Commonly known as: ZYLOPRIM   amiodarone 200 MG tablet Commonly known as: PACERONE   ARTIFICIAL SALIVA MT   cadexomer iodine 0.9 % gel Commonly known as: IODOSORB   cyclobenzaprine 10 MG tablet Commonly known as: FLEXERIL   lidocaine 5 % Commonly known as: LIDODERM   liraglutide 18 MG/3ML Sopn Commonly known as: VICTOZA   potassium chloride SA 20 MEQ tablet Commonly known as: KLOR-CON M   Propylene Glycol 0.6 % Soln   triamcinolone cream 0.1 % Commonly known as: KENALOG   warfarin 5 MG tablet Commonly known as: COUMADIN       TAKE these medications    acetaminophen 325 MG tablet Commonly known as: Tylenol Take 2 tablets (650 mg total) by mouth every 6 (six) hours as needed for up to 30 doses for mild pain or moderate pain.   apixaban 5 MG Tabs tablet Commonly known as: ELIQUIS Take 5 mg by mouth 2 (two) times daily.   atorvastatin 80 MG tablet Commonly known as: LIPITOR Take 80 mg by mouth at bedtime.   benzonatate 100 MG capsule Commonly known as: Tessalon Perles Take 1 capsule (100 mg total) by mouth 3 (three) times daily for 7 days.   bisacodyl 10 MG suppository Commonly known as: DULCOLAX Place 1 suppository (10 mg total) rectally daily.   cetirizine 10 MG tablet Commonly known as: ZYRTEC Take 10 mg by mouth at bedtime.   cholecalciferol 25 MCG (1000 UNIT) tablet Commonly known as: VITAMIN D3 Take 1,000 Units by mouth daily.   citalopram 20 MG tablet Commonly known as: CELEXA Take 10 mg by mouth daily.   dextromethorphan-guaiFENesin 30-600 MG 12hr tablet Commonly known as: MUCINEX DM Take 1 tablet by mouth 2 (two) times daily as needed for cough.   finasteride 5 MG tablet Commonly known as: PROSCAR Take 5 mg by mouth daily.   gabapentin 400 MG capsule Commonly known as: NEURONTIN Take 400 mg by mouth 2 (two)  times daily.   hydrOXYzine 100 MG capsule Commonly known as: VISTARIL Take 100 mg by mouth as needed for itching or anxiety.   insulin aspart protamine - aspart (70-30) 100 UNIT/ML FlexPen Commonly known as: NOVOLOG 70/30 MIX Inject 15 Units into the skin in the morning and at bedtime. What changed: how much to take   levothyroxine 100 MCG tablet Commonly known as: SYNTHROID Take 100 mcg by mouth 2 (two) times daily.   losartan 100 MG tablet Commonly known as: COZAAR Take 25 mg by mouth at bedtime.   memantine 10 MG tablet Commonly known as: NAMENDA Take 10 mg by mouth 2 (two) times daily.   metoprolol 200 MG 24 hr tablet Commonly known as: TOPROL-XL Take 100 mg by mouth daily.   naloxone 4 MG/0.1ML  Liqd nasal spray kit Commonly known as: NARCAN Place 1 spray into the nose once. For opioid overdose.   oxybutynin 5 MG tablet Commonly known as: DITROPAN Take 5 mg by mouth daily.   oxyCODONE-acetaminophen 5-325 MG tablet Commonly known as: Percocet Take 1 tablet by mouth every 4 (four) hours as needed for severe pain.   potassium chloride 10 MEQ tablet Commonly known as: Klor-Con 10 Take 2 tablets (20 mEq total) by mouth daily.   terazosin 5 MG capsule Commonly known as: HYTRIN Take 5 mg by mouth at bedtime.   torsemide 20 MG tablet Commonly known as: DEMADEX Take 40 mg by mouth 2 (two) times daily.   traMADol 50 MG tablet Commonly known as: ULTRAM Take 50 mg by mouth daily.               Discharge Care Instructions  (From admission, onward)           Start     Ordered   10/19/21 0000  Discharge wound care:       Comments: Place a sacral foam pad on bottom daily.   10/19/21 1204   10/19/21 0000  Non weight bearing       Question Answer Comment  Laterality left   Extremity Lower      10/19/21 1204           Disposition: Home Diet recommendation: Cardiac diet  Discharge Exam: Vitals:   10/18/21 1648 10/18/21 2059 10/19/21 0411  10/19/21 0915  BP: (!) 140/65 (!) 111/57 (!) 143/76 (!) 113/50  Pulse: 70 70 81 75  Resp: 19 20 20 17   Temp: 98 F (36.7 C) 97.9 F (36.6 C) 98.7 F (37.1 C) 98.1 F (36.7 C)  TempSrc:  Oral Oral Oral  SpO2: 97% 97% 97% 95%  Weight:      Height:       General: Appear in mild distress; no visible Abnormal Neck Mass Or lumps, Conjunctiva normal Cardiovascular: S1 and S2 Present, no Murmur, Respiratory: good respiratory effort, Bilateral Air entry present and CTA, no Crackles, no wheezes Abdomen: Bowel Sound present, Non tender  Extremities: trace Pedal edema Neurology: alert and oriented to time, place, and person  Filed Weights   10/12/21 1137  Weight: 128 kg   Condition at discharge: stable  The results of significant diagnostics from this hospitalization (including imaging, microbiology, ancillary and laboratory) are listed below for reference.   Imaging Studies: DG CHEST PORT 1 VIEW  Result Date: 10/19/2021 CLINICAL DATA:  Onset of cough and vomiting this morning EXAM: PORTABLE CHEST 1 VIEW COMPARISON:  Portable exam 1030 hours compared 10/12/2021 FINDINGS: LEFT subclavian sequential transvenous pacemaker leads project at RIGHT atrium and RIGHT ventricle. Borderline enlargement of cardiac silhouette. Mediastinal contours and pulmonary vascularity normal. Mild chronic peribronchial thickening with bibasilar atelectasis. Lungs otherwise clear. No infiltrate, pleural effusion, or pneumothorax. Osseous structures unremarkable. IMPRESSION: Chronic bronchitic changes with bibasilar atelectasis. Electronically Signed   By: Lavonia Dana M.D.   On: 10/19/2021 10:38   DG Abd Portable 1V  Result Date: 10/17/2021 CLINICAL DATA:  Nausea and vomiting EXAM: PORTABLE ABDOMEN - 1 VIEW COMPARISON:  10/15/2021 FINDINGS: Bowel gas pattern similar without evidence of obstruction. Moderate amount of air within small and large bowel, but with a normal pattern. Surgical clips as seen previously.  IMPRESSION: No change. Bowel gas pattern within normal limits by plain radiography. Electronically Signed   By: Nelson Chimes M.D.   On: 10/17/2021 08:31   DG Abd  Portable 1V  Result Date: 10/15/2021 CLINICAL DATA:  Follow-up ileus. EXAM: PORTABLE ABDOMEN - 1 VIEW COMPARISON:  10/14/2021 FINDINGS: No dilated small bowel loops identified. Gas noted within the left colon. Moderate retained stool identified within the colon. Multiple surgical clips are identified along the left hemiabdomen. IMPRESSION: Nonobstructive bowel gas pattern. Electronically Signed   By: Kerby Moors M.D.   On: 10/15/2021 08:33   DG Abd Portable 1V  Result Date: 10/14/2021 CLINICAL DATA:  Nasogastric tube positioning. EXAM: PORTABLE ABDOMEN - 1 VIEW COMPARISON:  10/14/2021 FINDINGS: Interval placement of nasogastric tube with tip and side-port over the stomach in the left upper quadrant. Remainder of the exam is unchanged. IMPRESSION: Nasogastric tube with tip and side-port over the stomach in the left upper quadrant. Electronically Signed   By: Marin Olp M.D.   On: 10/14/2021 14:14   CT HEAD WO CONTRAST (5MM)  Result Date: 10/14/2021 CLINICAL DATA:  Head trauma.  Fall. EXAM: CT HEAD WITHOUT CONTRAST TECHNIQUE: Contiguous axial images were obtained from the base of the skull through the vertex without intravenous contrast. RADIATION DOSE REDUCTION: This exam was performed according to the departmental dose-optimization program which includes automated exposure control, adjustment of the mA and/or kV according to patient size and/or use of iterative reconstruction technique. COMPARISON:  None Available. FINDINGS: Brain: No acute intracranial hemorrhage. No focal mass lesion. No CT evidence of acute infarction. No midline shift or mass effect. No hydrocephalus. Basilar cisterns are patent. There are periventricular and subcortical white matter hypodensities. Generalized cortical atrophy. Vascular: No hyperdense vessel or  unexpected calcification. Skull: Normal. Negative for fracture or focal lesion. Sinuses/Orbits: Paranasal sinuses and mastoid air cells are clear. Orbits are clear. Other: None. IMPRESSION: 1. No intracranial trauma. 2. Atrophy and white matter microvascular disease Electronically Signed   By: Suzy Bouchard M.D.   On: 10/14/2021 10:51   CT ABDOMEN PELVIS WO CONTRAST  Result Date: 10/14/2021 CLINICAL DATA:  Nausea and vomiting. EXAM: CT ABDOMEN AND PELVIS WITHOUT CONTRAST TECHNIQUE: Multidetector CT imaging of the abdomen and pelvis was performed following the standard protocol without IV contrast. RADIATION DOSE REDUCTION: This exam was performed according to the departmental dose-optimization program which includes automated exposure control, adjustment of the mA and/or kV according to patient size and/or use of iterative reconstruction technique. COMPARISON:  CTA chest abdomen and pelvis 10/12/2021 FINDINGS: Lower chest: Bibasilar atelectasis with tiny bilateral pleural effusions. Hepatobiliary: Nodular liver contour is compatible with cirrhosis. Calcified gallstones evident. No intrahepatic or extrahepatic biliary dilation. Pancreas: Pancreas is diffusely atrophic without main duct dilatation.2 cm low-density structure in the uncinate process (image 39/series 4) is stable since prior and again measures water density. A second cystic lesion identified previously in the tail the pancreas is not evident on the current study. Spleen: No splenomegaly. No focal mass lesion. Adrenals/Urinary Tract: Right adrenal gland unremarkable. Left adrenal gland not visualized. Right kidney and ureter are unremarkable. Left kidney surgically absent. Subtle increased attenuation of urine in the bladder lumen is consistent with the contrast infused recent CTA. Stomach/Bowel: Stomach is distended with gas and fluid. Duodenum is normally positioned as is the ligament of Treitz. No small bowel wall thickening. No small bowel  dilatation. The terminal ileum is normal. The appendix is not well visualized, but there is no edema or inflammation in the region of the cecum. No gross colonic mass. No colonic wall thickening. Diverticular changes are noted in the left colon without evidence of diverticulitis. Vascular/Lymphatic: There is moderate atherosclerotic calcification  of the abdominal aorta without aneurysm. There is no gastrohepatic or hepatoduodenal ligament lymphadenopathy. No retroperitoneal or mesenteric lymphadenopathy. No pelvic sidewall lymphadenopathy. Reproductive: The prostate gland and seminal vesicles are unremarkable. Other: No intraperitoneal free fluid. Musculoskeletal: No worrisome lytic or sclerotic osseous abnormality. 9 mm sclerotic lesion in the L4 vertebral body is stable. Tiny umbilical hernia contains only fat. IMPRESSION: 1. No acute findings in the abdomen or pelvis. Specifically, no findings to explain the patient's history of nausea and vomiting. 2. Nodular liver contour compatible with cirrhosis. 3. 2 cm water density lesion in the uncinate process of the pancreas is stable in the interval. This may be a cyst/pseudocyst or side branch IPMN. Permanent pacemaker may preclude MRI. As such, repeat CT in 6 months recommended to ensure stability. This recommendation follows ACR consensus guidelines: Management of Incidental Pancreatic Cysts: A White Paper of the ACR Incidental Findings Committee. J Am Coll Radiol 9150;56:979-480. 4. Stable small sclerotic focus in the L4 vertebral body in this patient status post left nephrectomy. Attention on follow-up recommended. 5. Cholelithiasis. 6. Left colonic diverticulosis without diverticulitis. 7. Status post left nephrectomy. 8. Aortic Atherosclerosis (ICD10-I70.0). Electronically Signed   By: Misty Stanley M.D.   On: 10/14/2021 07:24   DG Abd 1 View  Result Date: 10/14/2021 CLINICAL DATA:  Vomiting EXAM: ABDOMEN - 1 VIEW COMPARISON:  None Available. FINDINGS:  Scattered large and small bowel gas is noted. No obstructive changes are seen. No free air is noted. No acute bony abnormality is noted. Changes consistent with prior left nephrectomy are seen. IMPRESSION: No acute abnormality noted. Electronically Signed   By: Inez Catalina M.D.   On: 10/14/2021 01:16   ECHOCARDIOGRAM COMPLETE  Result Date: 10/13/2021    ECHOCARDIOGRAM REPORT   Patient Name:   LEDFORD GOODSON Molina Date of Exam: 10/13/2021 Medical Rec #:  [id number]     Height:       69.0 in Accession #:    [id number]    Weight:       282.2 lb Date of Birth:  01-03-1946     BSA:          2.392 m Patient Age:    76 years      BP:           139/78 mmHg Patient Gender: M             HR:           81 bpm. Exam Location:  Inpatient Procedure: 2D Echo, Intracardiac Opacification Agent, Color Doppler and Cardiac            Doppler Indications:    syncope  History:        Patient has no prior history of Echocardiogram examinations.                 Arrythmias:Atrial Fibrillation; Risk Factors:Diabetes,                 Dyslipidemia and Hypertension.  Sonographer:    Memory Argue Referring Phys: [place]  Sonographer Comments: Technically difficult study due to poor echo windows. IMPRESSIONS  1. Left ventricular ejection fraction, by estimation, is 65 to 70%. The left ventricle has normal function. The left ventricle has no regional wall motion abnormalities. Left ventricular diastolic function could not be evaluated.  2. Right ventricular systolic function is normal. The right ventricular size is normal. There is severely elevated pulmonary artery systolic pressure. The estimated right ventricular systolic pressure is 20.1 mmHg.  3. Left atrial size was moderately dilated.  4. The mitral valve is abnormal. Trivial mitral valve regurgitation.  5. The aortic valve is tricuspid. Aortic valve regurgitation is not visualized. Mild aortic valve stenosis. Aortic valve area, by VTI measures 1.65 cm. Aortic valve mean gradient  measures 9.0 mmHg. Aortic valve Vmax measures 1.98 m/s. DI is 0.43.  6. The inferior vena cava is dilated in size with <50% respiratory variability, suggesting right atrial pressure of 15 mmHg. Comparison(s): No prior Echocardiogram. FINDINGS  Left Ventricle: Left ventricular ejection fraction, by estimation, is 65 to 70%. The left ventricle has normal function. The left ventricle has no regional wall motion abnormalities. Definity contrast agent was given IV to delineate the left ventricular  endocardial borders. The left ventricular internal cavity size was normal in size. There is no left ventricular hypertrophy. Abnormal (paradoxical) septal motion, consistent with RV pacemaker. Left ventricular diastolic function could not be evaluated due to paced rhythm. Left ventricular diastolic function could not be evaluated. Right Ventricle: The right ventricular size is normal. No increase in right ventricular wall thickness. Right ventricular systolic function is normal. There is severely elevated pulmonary artery systolic pressure. The tricuspid regurgitant velocity is 3.37 m/s, and with an assumed right atrial pressure of 15 mmHg, the estimated right ventricular systolic pressure is 10.2 mmHg. Left Atrium: Left atrial size was moderately dilated. Right Atrium: Right atrial size was not well visualized. Pericardium: Trivial pericardial effusion is present. The pericardial effusion is posterior to the left ventricle. Mitral Valve: The mitral valve is abnormal. There is mild calcification of the anterior and posterior mitral valve leaflet(s). Mild to moderate mitral annular calcification. Trivial mitral valve regurgitation. Tricuspid Valve: The tricuspid valve is grossly normal. Tricuspid valve regurgitation is mild. Aortic Valve: The aortic valve is tricuspid. Aortic valve regurgitation is not visualized. Mild aortic stenosis is present. Aortic valve mean gradient measures 9.0 mmHg. Aortic valve peak gradient measures  15.7 mmHg. Aortic valve area, by VTI measures 1.65 cm. Pulmonic Valve: The pulmonic valve was normal in structure. Pulmonic valve regurgitation is not visualized. Aorta: The aortic root and ascending aorta are structurally normal, with no evidence of dilitation. Venous: The inferior vena cava is dilated in size with less than 50% respiratory variability, suggesting right atrial pressure of 15 mmHg. IAS/Shunts: The interatrial septum was not well visualized. Additional Comments: A device lead is visualized.  LEFT VENTRICLE PLAX 2D LVIDd:         5.20 cm LVIDs:         4.00 cm LV PW:         1.10 cm LV IVS:        1.00 cm LVOT diam:     2.20 cm LV SV:         73 LV SV Index:   31 LVOT Area:     3.80 cm  RIGHT VENTRICLE RV S prime:     11.10 cm/s TAPSE (M-mode): 1.7 cm LEFT ATRIUM            Index LA Vol (A4C): 113.0 ml 47.25 ml/m  AORTIC VALVE AV Area (Vmax):    1.67 cm AV Area (Vmean):   1.57 cm AV Area (VTI):     1.65 cm AV Vmax:           198.00 cm/s AV Vmean:          145.000 cm/s AV VTI:            0.442 m AV Peak  Grad:      15.7 mmHg AV Mean Grad:      9.0 mmHg LVOT Vmax:         86.80 cm/s LVOT Vmean:        60.000 cm/s LVOT VTI:          0.192 m LVOT/AV VTI ratio: 0.43  AORTA Ao Root diam: 3.00 cm Ao Asc diam:  3.40 cm TRICUSPID VALVE TR Peak grad:   45.4 mmHg TR Vmax:        337.00 cm/s  SHUNTS Systemic VTI:  0.19 m Systemic Diam: 2.20 cm Lyman Bishop MD Electronically signed by Lyman Bishop MD Signature Date/Time: 10/13/2021/3:29:01 PM    Final    CT FOOT LEFT WO CONTRAST  Result Date: 10/12/2021 CLINICAL DATA:  Foot fracture. EXAM: CT OF THE LEFT FOOT WITHOUT CONTRAST TECHNIQUE: Multidetector CT imaging of the left foot was performed according to the standard protocol. Multiplanar CT image reconstructions were also generated. RADIATION DOSE REDUCTION: This exam was performed according to the departmental dose-optimization program which includes automated exposure control, adjustment of the mA  and/or kV according to patient size and/or use of iterative reconstruction technique. COMPARISON:  Radiographs dated October 12, 2021. FINDINGS: Bones/Joint/Cartilage There is a mildly displaced fracture of the distal fibula. There is mildly displaced comminuted intra-articular fracture of the tibial plafond which extends to the posterior malleolus as well as medially to the medial malleolus. Tibiotalar and subtalar joints are maintained. Talonavicular and calcaneocuboid joints are also intact. Tarsometatarsal joints arthritic changes without evidence of displaced fracture. There are mildly displaced fractures of the 2nd-4th metatarsal head and neck junction. There is likely a bipartite medial sesamoid bone of the first digit. Ligaments Suboptimally assessed by CT. Muscles and Tendons Muscle and tendon appear intact.  No intramuscular hematoma. Soft tissues Soft tissue swelling and edema about the foot. No drainable fluid collection or hematoma. IMPRESSION: 1. Displaced fracture of the distal fibula just above the lateral malleolus. 2. Displaced comminuted intra-articular fracture of the tibial plafond involving the medial and posterior malleoli. 3. Displaced fracture of the 2nd-4th metatarsal head and neck junction. 4. Questionable fracture of the fifth metatarsal head and neck junction. 5.  Muscles and tendons appear intact. 6. Soft tissue swelling and edema prominent about the dorsum of the foot. Electronically Signed   By: Keane Police D.O.   On: 10/12/2021 18:58   CT Angio Chest/Abd/Pel for Dissection W and/or Wo Contrast  Result Date: 10/12/2021 CLINICAL DATA:  Syncope.  Widened mediastinum on chest x-ray. EXAM: CT ANGIOGRAPHY CHEST, ABDOMEN AND PELVIS TECHNIQUE: Non-contrast CT of the chest was initially obtained. Multidetector CT imaging through the chest, abdomen and pelvis was performed using the standard protocol during bolus administration of intravenous contrast. Multiplanar reconstructed images and  MIPs were obtained and reviewed to evaluate the vascular anatomy. RADIATION DOSE REDUCTION: This exam was performed according to the departmental dose-optimization program which includes automated exposure control, adjustment of the mA and/or kV according to patient size and/or use of iterative reconstruction technique. CONTRAST:  39m OMNIPAQUE IOHEXOL 350 MG/ML SOLN COMPARISON:  None Available. FINDINGS: CTA CHEST FINDINGS Cardiovascular: Preferential opacification of the thoracic aorta. No evidence of thoracic aortic aneurysm or dissection. The heart is mildly enlarged. There is no pericardial effusion. There are atherosclerotic calcifications of the aorta and coronary arteries. Left-sided pacemaker is present with leads ending in the right atrium and right ventricle. There is no large central pulmonary embolism. Mediastinum/Nodes: No enlarged mediastinal, hilar, or axillary lymph  nodes. Thyroid gland, trachea, and esophagus demonstrate no significant findings. Lungs/Pleura: There are minimal patchy airspace and ground-glass opacities in the bilateral lower lobes, left greater than right. There is no pleural effusion or pneumothorax. Trachea and central airways are patent. 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 No acute fractures are seen. Review of the MIP images confirms the above findings. CTA ABDOMEN AND PELVIS FINDINGS VASCULAR Aorta: Normal caliber aorta without aneurysm, dissection, vasculitis or significant stenosis. There are atherosclerotic calcifications of the aorta. Celiac: Patent without evidence of aneurysm, dissection, vasculitis or significant stenosis. SMA: Patent without evidence of aneurysm, dissection, vasculitis or significant stenosis. Renals: Right renal artery is patent without evidence of aneurysm, dissection, vasculitis, fibromuscular dysplasia or significant stenosis. 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 IMA: Patent without evidence  of aneurysm, dissection, vasculitis or significant stenosis. Inflow: Patent without evidence of aneurysm, dissection, vasculitis or significant stenosis. Veins: No obvious venous abnormality within the limitations of this arterial phase study. Review of the MIP images confirms the above findings. NON-VASCULAR Hepatobiliary: There is questionable nodular liver contour. No focal liver lesions are seen. Gallstones are present. There is no biliary ductal dilatation. Pancreas: There is a 16 mm rounded hypodense area in the tail the pancreas image 5/146. There is a 2 cm rounded hypodense area in the uncinate process of the pancreas image 5/175. No pancreatic ductal dilatation or surrounding inflammatory changes. Spleen: Normal in size without focal abnormality. 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 Right kidney, right adrenal gland and bladder appear within normal limits. Stomach/Bowel: Stomach is within normal limits. Appendix appears normal. No evidence of bowel wall thickening, distention, or inflammatory changes. Colonic diverticula are present. The appendix is not seen. Lymphatic: No enlarged lymph nodes. Reproductive: Prostate is unremarkable. Other: No ascites.  Small fat containing umbilical hernia. Musculoskeletal: No acute fractures. There is a single rounded sclerotic density in the L4 vertebral body measuring 9 mm. Degenerative changes affect the spine and hips. Review of the MIP images confirms the above findings. IMPRESSION: 1. No evidence for aortic dissection or aneurysm. 2. Minimal patchy airspace and ground-glass opacities in the lung bases may represent atelectasis, mild edema or infection. 3. Cardiomegaly. 4. Questionable nodular liver contour. Correlate clinically for hepatocellular disease. 5. Cholelithiasis. 6. There are 2 hypodense lesions in the pancreas, possibly cystic. Recommend further evaluation with pancreatic MRI. 7. Left nephrectomy and adrenalectomy.  8. Single small sclerotic density in the L4 vertebral body, indeterminate. Correlate clinically for metastatic disease. Aortic Atherosclerosis (ICD10-I70.0). Electronically Signed   By: Ronney Asters M.D.   On: 10/12/2021 15:21   CT Head Wo Contrast  Result Date: 10/12/2021 CLINICAL DATA:  Fall.   Head, face and neck trauma. EXAM: CT HEAD WITHOUT CONTRAST CT MAXILLOFACIAL WITHOUT CONTRAST CT CERVICAL SPINE WITHOUT CONTRAST TECHNIQUE: Multidetector CT imaging of the head, cervical spine, and maxillofacial structures were performed using the standard protocol without intravenous contrast. Multiplanar CT image reconstructions of the cervical spine and maxillofacial structures were also generated. RADIATION DOSE REDUCTION: This exam was performed according to the departmental dose-optimization program which includes automated exposure control, adjustment of the mA and/or kV according to patient size and/or use of iterative reconstruction technique. COMPARISON:  None Available. FINDINGS: CT HEAD FINDINGS Brain: No evidence of acute infarction, hemorrhage, hydrocephalus, extra-axial collection or mass lesion/mass effect. Mild cerebral volume loss. Encephalomalacia of the left occipital lobe and advanced chronic microvascular ischemic changes of the periventricular and subcortical  white matter, unchanged. Vascular: No hyperdense vessel or unexpected calcification. Skull: Normal. Negative for fracture or focal lesion. Other: None. CT MAXILLOFACIAL FINDINGS Osseous: No fracture or mandibular dislocation. No destructive process. Orbits: Negative. No traumatic or inflammatory finding. Sinuses: Mucosal thickening of the ethmoid air cells, remaining paranasal sinuses and mastoid air cells are clear. Soft tissues: Negative. CT CERVICAL SPINE FINDINGS Alignment: Straightening of the cervical spine. Skull base and vertebrae: No acute fracture. No primary bone lesion or focal pathologic process. Soft tissues and spinal canal: No  prevertebral fluid or swelling. No visible canal hematoma. 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 Associated uncovertebral joint and facet joint arthropathy. C2-C3: No significant disc bulge, spinal canal or neural foraminal stenosis. C3-C4: Disc height loss and uncovertebral joint arthropathy with mild left and moderate right neural foraminal stenosis. Mild bilateral facet joint arthropathy. C4-C5: Disc height loss and uncovertebral joint arthropathy with mild left neural foraminal stenosis. Moderate left facet joint arthropathy. C5-C6: Near complete loss of the disc space with disc osteophyte fight complex with mild narrowing of the spinal canal. Mild-to-moderate bilateral neural foraminal stenosis. C6-C7: Disc height loss and disc osteophyte complex with mild spinal canal stenosis. Mild right neural foraminal stenosis. C7-T1: Disc height loss without significant spinal canal or neural foraminal stenosis. Upper chest: Negative. Other: None IMPRESSION: CT head: 1. No acute intracranial abnormality. 2. Mild cerebral atrophy, advanced chronic microvascular ischemic changes of the white matter. Chronic left occipital lobe infarct. CT maxillofacial: 1. No fracture or dislocation. 2. Orbits are unremarkable.  No significant soft tissue injury. CT cervical spine: 1. No acute fracture or traumatic subluxation. 2. 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 Multilevel facet joint and uncovertebral vertebral joint arthropathy. Electronically Signed   By: Keane Police D.O.   On: 10/12/2021 12:53   CT Cervical Spine Wo Contrast  Result Date: 10/12/2021 CLINICAL DATA:  Fall.   Head, face and neck trauma. EXAM: CT HEAD WITHOUT CONTRAST CT MAXILLOFACIAL WITHOUT CONTRAST CT CERVICAL SPINE WITHOUT CONTRAST TECHNIQUE: Multidetector CT imaging of the head, cervical spine, and maxillofacial  structures were performed using the standard protocol without intravenous contrast. Multiplanar CT image reconstructions of the cervical spine and maxillofacial structures were also generated. RADIATION DOSE REDUCTION: This exam was performed according to the departmental dose-optimization program which includes automated exposure control, adjustment of the mA and/or kV according to patient size and/or use of iterative reconstruction technique. COMPARISON:  None Available. FINDINGS: CT HEAD FINDINGS Brain: No evidence of acute infarction, hemorrhage, hydrocephalus, extra-axial collection or mass lesion/mass effect. Mild cerebral volume loss. Encephalomalacia of the left occipital lobe and advanced chronic microvascular ischemic changes of the periventricular and subcortical white matter, unchanged. Vascular: No hyperdense vessel or unexpected calcification. Skull: Normal. Negative for fracture or focal lesion. Other: None. CT MAXILLOFACIAL FINDINGS Osseous: No fracture or mandibular dislocation. No destructive process. Orbits: Negative. No traumatic or inflammatory finding. Sinuses: Mucosal thickening of the ethmoid air cells, remaining paranasal sinuses and mastoid air cells are clear. Soft tissues: Negative. CT CERVICAL SPINE FINDINGS Alignment: Straightening of the cervical spine. Skull base and vertebrae: No acute fracture. No primary bone lesion or focal pathologic process. Soft tissues and spinal canal: No prevertebral fluid or swelling. No visible canal hematoma. 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 Associated uncovertebral joint and facet joint arthropathy. C2-C3: No significant disc  bulge, spinal canal or neural foraminal stenosis. C3-C4: Disc height loss and uncovertebral joint arthropathy with mild left and moderate right neural foraminal stenosis. Mild bilateral facet joint arthropathy. C4-C5: Disc height loss and uncovertebral  joint arthropathy with mild left neural foraminal stenosis. Moderate left facet joint arthropathy. C5-C6: Near complete loss of the disc space with disc osteophyte fight complex with mild narrowing of the spinal canal. Mild-to-moderate bilateral neural foraminal stenosis. C6-C7: Disc height loss and disc osteophyte complex with mild spinal canal stenosis. Mild right neural foraminal stenosis. C7-T1: Disc height loss without significant spinal canal or neural foraminal stenosis. Upper chest: Negative. Other: None IMPRESSION: CT head: 1. No acute intracranial abnormality. 2. Mild cerebral atrophy, advanced chronic microvascular ischemic changes of the white matter. Chronic left occipital lobe infarct. CT maxillofacial: 1. No fracture or dislocation. 2. Orbits are unremarkable.  No significant soft tissue injury. CT cervical spine: 1. No acute fracture or traumatic subluxation. 2. 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 Multilevel facet joint and uncovertebral vertebral joint arthropathy. Electronically Signed   By: Keane Police D.O.   On: 10/12/2021 12:53   CT Maxillofacial Wo Contrast  Result Date: 10/12/2021 CLINICAL DATA:  Fall.   Head, face and neck trauma. EXAM: CT HEAD WITHOUT CONTRAST CT MAXILLOFACIAL WITHOUT CONTRAST CT CERVICAL SPINE WITHOUT CONTRAST TECHNIQUE: Multidetector CT imaging of the head, cervical spine, and maxillofacial structures were performed using the standard protocol without intravenous contrast. Multiplanar CT image reconstructions of the cervical spine and maxillofacial structures were also generated. RADIATION DOSE REDUCTION: This exam was performed according to the departmental dose-optimization program which includes automated exposure control, adjustment of the mA and/or kV according to patient size and/or use of iterative reconstruction technique. COMPARISON:  None Available. FINDINGS: CT HEAD FINDINGS Brain: No  evidence of acute infarction, hemorrhage, hydrocephalus, extra-axial collection or mass lesion/mass effect. Mild cerebral volume loss. Encephalomalacia of the left occipital lobe and advanced chronic microvascular ischemic changes of the periventricular and subcortical white matter, unchanged. Vascular: No hyperdense vessel or unexpected calcification. Skull: Normal. Negative for fracture or focal lesion. Other: None. CT MAXILLOFACIAL FINDINGS Osseous: No fracture or mandibular dislocation. No destructive process. Orbits: Negative. No traumatic or inflammatory finding. Sinuses: Mucosal thickening of the ethmoid air cells, remaining paranasal sinuses and mastoid air cells are clear. Soft tissues: Negative. CT CERVICAL SPINE FINDINGS Alignment: Straightening of the cervical spine. Skull base and vertebrae: No acute fracture. No primary bone lesion or focal pathologic process. Soft tissues and spinal canal: No prevertebral fluid or swelling. No visible canal hematoma. 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 Associated uncovertebral joint and facet joint arthropathy. C2-C3: No significant disc bulge, spinal canal or neural foraminal stenosis. C3-C4: Disc height loss and uncovertebral joint arthropathy with mild left and moderate right neural foraminal stenosis. Mild bilateral facet joint arthropathy. C4-C5: Disc height loss and uncovertebral joint arthropathy with mild left neural foraminal stenosis. Moderate left facet joint arthropathy. C5-C6: Near complete loss of the disc space with disc osteophyte fight complex with mild narrowing of the spinal canal. Mild-to-moderate bilateral neural foraminal stenosis. C6-C7: Disc height loss and disc osteophyte complex with mild spinal canal stenosis. Mild right neural foraminal stenosis. C7-T1: Disc height loss without significant spinal canal or neural foraminal stenosis. Upper chest: Negative. Other:  None IMPRESSION: CT head: 1. No acute intracranial abnormality. 2. Mild cerebral atrophy, advanced chronic microvascular ischemic changes  of the white matter. Chronic left occipital lobe infarct. CT maxillofacial: 1. No fracture or dislocation. 2. Orbits are unremarkable.  No significant soft tissue injury. CT cervical spine: 1. No acute fracture or traumatic subluxation. 2. 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 Multilevel facet joint and uncovertebral vertebral joint arthropathy. Electronically Signed   By: Keane Police D.O.   On: 10/12/2021 12:53   DG Foot Complete Left  Result Date: 10/12/2021 CLINICAL DATA:  Trauma. Patient became dizzy and fell from standing position today, pain in right knee and left foot. EXAM: LEFT FOOT - COMPLETE 3+ VIEW COMPARISON:  None Available. FINDINGS: Mildly displaced 2-5 LEFT metatarsal head fractures, with intra-articular extension at the 5th MTP joint. Normal appearance of Lisfranc joint. Degenerative changes within the LEFT foot, including plantar and calcaneal enthesophytes. IMPRESSION: Mildly displaced LEFT 2-5 metatarsal head fractures, with intra-articular extension at the 5th MTP joint. Electronically Signed   By: Michaelle Birks M.D.   On: 10/12/2021 12:28   DG Pelvis Portable  Result Date: 10/12/2021 CLINICAL DATA:  Trauma, dizziness, fell from standing position EXAM: PORTABLE PELVIS 1-2 VIEWS COMPARISON:  Portable exam 1156 hours without priors for comparison FINDINGS: Osseous mineralization normal. Superior pelvics excluded. Hip and SI joint spaces appear preserved. No fracture, dislocation or bone destruction. IMPRESSION: No acute osseous abnormalities. Electronically Signed   By: Lavonia Dana M.D.   On: 10/12/2021 12:26   DG Knee 2 Views Right  Result Date: 10/12/2021 CLINICAL DATA:  RIGHT knee pain post fall EXAM: RIGHT KNEE - 1-2 VIEW COMPARISON:  RIGHT tibial and fibular radiographs  12/11/2019 FINDINGS: Mild medial compartment joint space narrowing. Osseous mineralization normal. Patellar enthesophytes. No acute fracture, dislocation, or bone destruction. No joint effusion. IMPRESSION: No acute osseous abnormalities. Electronically Signed   By: Lavonia Dana M.D.   On: 10/12/2021 12:24   DG Chest Port 1 View  Result Date: 10/12/2021 CLINICAL DATA:  Trauma EXAM: PORTABLE CHEST 1 VIEW COMPARISON:  Chest x-ray dated October 21, 2020 FINDINGS: New widening of the mediastinum, likely related to exam technique. Cardiac contours are unchanged. Chest wall dual lead pacer with unchanged lead position. Mild bibasilar opacities which are likely due to atelectasis. Lungs otherwise clear. No large pleural effusion or pneumothorax. IMPRESSION: 1. New widening of the mediastinum, likely related to exam technique. Finding could be further evaluated with PA and lateral chest x-ray. If there is concern for traumatic aortic injury, CTA chest could be performed. 2. Mild bibasilar opacities which are likely due to atelectasis. Lungs otherwise clear. Electronically Signed   By: Yetta Glassman M.D.   On: 10/12/2021 12:24    Microbiology: Results for orders placed or performed during the hospital encounter of 10/12/21  Resp Panel by RT-PCR (Flu A&B, Covid) Anterior Nasal Swab     Status: None   Collection Time: 10/12/21 11:43 AM   Specimen: Anterior Nasal Swab  Result Value Ref Range Status   SARS Coronavirus 2 by RT PCR NEGATIVE NEGATIVE Final    Comment: (NOTE) SARS-CoV-2 target nucleic acids are NOT DETECTED.  The SARS-CoV-2 RNA is generally detectable in upper respiratory specimens during the acute phase of infection. The lowest concentration of SARS-CoV-2 viral copies this assay can detect is 138 copies/mL. A negative result does not preclude SARS-Cov-2 infection and should not be used as the sole basis for treatment or other patient management decisions. A negative result may occur with   improper specimen collection/handling, submission  of specimen other than nasopharyngeal swab, presence of viral mutation(s) within the areas targeted by this assay, and inadequate number of viral copies(<138 copies/mL). A negative result must be combined with clinical observations, patient history, and epidemiological information. The expected result is Negative.  Fact Sheet for Patients:  EntrepreneurPulse.com.au  Fact Sheet for Healthcare Providers:  IncredibleEmployment.be  This test is no t yet approved or cleared by the [place] FDA and  has been authorized for detection and/or diagnosis of SARS-CoV-2 by FDA under an Emergency Use Authorization (EUA). This EUA will remain  in effect (meaning this test can be used) for the duration of the COVID-19 declaration under Section 564(b)(1) of the Act, 21 U.S.C.section 360bbb-3(b)(1), unless the authorization is terminated  or revoked sooner.       Influenza A by PCR NEGATIVE NEGATIVE Final   Influenza B by PCR NEGATIVE NEGATIVE Final    Comment: (NOTE) The Xpert Xpress SARS-CoV-2/FLU/RSV plus assay is intended as an aid in the diagnosis of influenza from Nasopharyngeal swab specimens and should not be used as a sole basis for treatment. Nasal washings and aspirates are unacceptable for Xpert Xpress SARS-CoV-2/FLU/RSV testing.  Fact Sheet for Patients: EntrepreneurPulse.com.au  Fact Sheet for Healthcare Providers: IncredibleEmployment.be  This test is not yet approved or cleared by the [place] FDA and has been authorized for detection and/or diagnosis of SARS-CoV-2 by FDA under an Emergency Use Authorization (EUA). This EUA will remain in effect (meaning this test can be used) for the duration of the COVID-19 declaration under Section 564(b)(1) of the Act, 21 U.S.C. section 360bbb-3(b)(1), unless the authorization is terminated  or revoked.  Performed at [facility], [place] [address] [place], [place] [address]    Labs: CBC: Recent Labs  Lab 10/15/21 0254 10/16/21 1004 10/17/21 0432 10/18/21 0453 10/19/21 0553  WBC 12.7* 14.6* 14.6* 12.7* 13.8*  NEUTROABS 10.3* 11.9* 11.6* 10.2* 11.1*  HGB 12.4* 12.7* 12.5* 11.6* 11.8*  HCT 36.4* 37.4* 37.9* 34.2* 34.7*  MCV 90.5 91.0 94.0 91.7 90.8  PLT 163 190 231 204 940   Basic Metabolic Panel: Recent Labs  Lab 10/15/21 0254 10/16/21 1004 10/17/21 0432 10/18/21 0453 10/19/21 0553  NA 142 142 144 139 138  K 3.6 3.1* 3.4* 2.9* 3.4*  CL 102 104 106 108 106  CO2 30 28 30 25 25   GLUCOSE 226* 176* 132* 120* 94  BUN 23 20 21 21 17   CREATININE 1.64* 1.45* 1.46* 1.29* 1.27*  CALCIUM 8.9 8.6* 8.3* 7.7* 8.2*  MG 2.1 2.0 2.1 2.0 1.9   Liver Function Tests: No results for input(s): "AST", "ALT", "ALKPHOS", "BILITOT", "PROT", "ALBUMIN" in the last 168 hours. CBG: Recent Labs  Lab 10/18/21 2055 10/18/21 2355 10/19/21 0413 10/19/21 0747 10/19/21 1139  GLUCAP 150* 132* 83 118* 183*    Discharge time spent: greater than 30 minutes.  Signed: Berle Mull, MD [facility] 10/19/2021

## 2021-10-22 DIAGNOSIS — I4891 Unspecified atrial fibrillation: Secondary | ICD-10-CM | POA: Diagnosis not present

## 2021-10-22 DIAGNOSIS — E1122 Type 2 diabetes mellitus with diabetic chronic kidney disease: Secondary | ICD-10-CM | POA: Diagnosis not present

## 2021-10-22 DIAGNOSIS — R059 Cough, unspecified: Secondary | ICD-10-CM | POA: Diagnosis not present

## 2021-10-22 DIAGNOSIS — I2510 Atherosclerotic heart disease of native coronary artery without angina pectoris: Secondary | ICD-10-CM | POA: Diagnosis not present

## 2021-10-22 DIAGNOSIS — S82892D Other fracture of left lower leg, subsequent encounter for closed fracture with routine healing: Secondary | ICD-10-CM | POA: Diagnosis not present

## 2021-10-22 DIAGNOSIS — S92902D Unspecified fracture of left foot, subsequent encounter for fracture with routine healing: Secondary | ICD-10-CM | POA: Diagnosis not present

## 2021-10-22 DIAGNOSIS — R7989 Other specified abnormal findings of blood chemistry: Secondary | ICD-10-CM | POA: Diagnosis not present

## 2021-10-22 DIAGNOSIS — N183 Chronic kidney disease, stage 3 unspecified: Secondary | ICD-10-CM | POA: Diagnosis not present

## 2021-10-22 DIAGNOSIS — I509 Heart failure, unspecified: Secondary | ICD-10-CM | POA: Diagnosis not present

## 2021-10-23 DIAGNOSIS — S92902D Unspecified fracture of left foot, subsequent encounter for fracture with routine healing: Secondary | ICD-10-CM | POA: Diagnosis not present

## 2021-10-23 DIAGNOSIS — I2510 Atherosclerotic heart disease of native coronary artery without angina pectoris: Secondary | ICD-10-CM | POA: Diagnosis not present

## 2021-10-23 DIAGNOSIS — S82892D Other fracture of left lower leg, subsequent encounter for closed fracture with routine healing: Secondary | ICD-10-CM | POA: Diagnosis not present

## 2021-10-23 DIAGNOSIS — R0981 Nasal congestion: Secondary | ICD-10-CM | POA: Diagnosis not present

## 2021-10-23 DIAGNOSIS — R7989 Other specified abnormal findings of blood chemistry: Secondary | ICD-10-CM | POA: Diagnosis not present

## 2021-10-23 DIAGNOSIS — I4891 Unspecified atrial fibrillation: Secondary | ICD-10-CM | POA: Diagnosis not present

## 2021-10-23 DIAGNOSIS — R059 Cough, unspecified: Secondary | ICD-10-CM | POA: Diagnosis not present

## 2021-10-23 DIAGNOSIS — R531 Weakness: Secondary | ICD-10-CM | POA: Diagnosis not present

## 2021-10-23 DIAGNOSIS — I509 Heart failure, unspecified: Secondary | ICD-10-CM | POA: Diagnosis not present

## 2021-10-23 DIAGNOSIS — E1122 Type 2 diabetes mellitus with diabetic chronic kidney disease: Secondary | ICD-10-CM | POA: Diagnosis not present

## 2021-10-24 DIAGNOSIS — I5023 Acute on chronic systolic (congestive) heart failure: Secondary | ICD-10-CM | POA: Diagnosis not present

## 2021-10-25 DIAGNOSIS — I5023 Acute on chronic systolic (congestive) heart failure: Secondary | ICD-10-CM | POA: Diagnosis not present

## 2021-10-26 DIAGNOSIS — U071 COVID-19: Secondary | ICD-10-CM | POA: Diagnosis not present

## 2021-10-26 DIAGNOSIS — R0981 Nasal congestion: Secondary | ICD-10-CM | POA: Diagnosis not present

## 2021-10-26 DIAGNOSIS — R410 Disorientation, unspecified: Secondary | ICD-10-CM | POA: Diagnosis not present

## 2021-10-26 DIAGNOSIS — R5381 Other malaise: Secondary | ICD-10-CM | POA: Diagnosis not present

## 2021-10-26 DIAGNOSIS — W19XXXA Unspecified fall, initial encounter: Secondary | ICD-10-CM | POA: Diagnosis not present

## 2021-10-26 DIAGNOSIS — S82872D Displaced pilon fracture of left tibia, subsequent encounter for closed fracture with routine healing: Secondary | ICD-10-CM | POA: Diagnosis not present

## 2021-10-26 DIAGNOSIS — I4891 Unspecified atrial fibrillation: Secondary | ICD-10-CM | POA: Diagnosis not present

## 2021-10-26 DIAGNOSIS — N189 Chronic kidney disease, unspecified: Secondary | ICD-10-CM | POA: Diagnosis not present

## 2021-10-26 DIAGNOSIS — F331 Major depressive disorder, recurrent, moderate: Secondary | ICD-10-CM | POA: Diagnosis not present

## 2021-10-26 DIAGNOSIS — M6259 Muscle wasting and atrophy, not elsewhere classified, multiple sites: Secondary | ICD-10-CM | POA: Diagnosis not present

## 2021-10-26 DIAGNOSIS — F322 Major depressive disorder, single episode, severe without psychotic features: Secondary | ICD-10-CM | POA: Diagnosis not present

## 2021-10-26 DIAGNOSIS — I130 Hypertensive heart and chronic kidney disease with heart failure and stage 1 through stage 4 chronic kidney disease, or unspecified chronic kidney disease: Secondary | ICD-10-CM | POA: Diagnosis not present

## 2021-10-26 DIAGNOSIS — E860 Dehydration: Secondary | ICD-10-CM | POA: Diagnosis not present

## 2021-10-26 DIAGNOSIS — N390 Urinary tract infection, site not specified: Secondary | ICD-10-CM | POA: Diagnosis not present

## 2021-10-26 DIAGNOSIS — S92902B Unspecified fracture of left foot, initial encounter for open fracture: Secondary | ICD-10-CM | POA: Diagnosis not present

## 2021-10-26 DIAGNOSIS — I5032 Chronic diastolic (congestive) heart failure: Secondary | ICD-10-CM | POA: Diagnosis not present

## 2021-10-26 DIAGNOSIS — R5383 Other fatigue: Secondary | ICD-10-CM | POA: Diagnosis not present

## 2021-10-26 DIAGNOSIS — N1832 Chronic kidney disease, stage 3b: Secondary | ICD-10-CM | POA: Diagnosis not present

## 2021-10-26 DIAGNOSIS — R0989 Other specified symptoms and signs involving the circulatory and respiratory systems: Secondary | ICD-10-CM | POA: Diagnosis not present

## 2021-10-26 DIAGNOSIS — S92302D Fracture of unspecified metatarsal bone(s), left foot, subsequent encounter for fracture with routine healing: Secondary | ICD-10-CM | POA: Diagnosis not present

## 2021-10-26 DIAGNOSIS — N183 Chronic kidney disease, stage 3 unspecified: Secondary | ICD-10-CM | POA: Diagnosis not present

## 2021-10-26 DIAGNOSIS — E785 Hyperlipidemia, unspecified: Secondary | ICD-10-CM | POA: Diagnosis not present

## 2021-10-26 DIAGNOSIS — E1169 Type 2 diabetes mellitus with other specified complication: Secondary | ICD-10-CM | POA: Diagnosis not present

## 2021-10-26 DIAGNOSIS — I10 Essential (primary) hypertension: Secondary | ICD-10-CM | POA: Diagnosis not present

## 2021-10-26 DIAGNOSIS — Z7401 Bed confinement status: Secondary | ICD-10-CM | POA: Diagnosis not present

## 2021-10-26 DIAGNOSIS — E039 Hypothyroidism, unspecified: Secondary | ICD-10-CM | POA: Diagnosis not present

## 2021-10-26 DIAGNOSIS — E669 Obesity, unspecified: Secondary | ICD-10-CM | POA: Diagnosis not present

## 2021-10-26 DIAGNOSIS — R0689 Other abnormalities of breathing: Secondary | ICD-10-CM | POA: Diagnosis not present

## 2021-10-26 DIAGNOSIS — L97429 Non-pressure chronic ulcer of left heel and midfoot with unspecified severity: Secondary | ICD-10-CM | POA: Diagnosis not present

## 2021-10-26 DIAGNOSIS — N179 Acute kidney failure, unspecified: Secondary | ICD-10-CM | POA: Diagnosis not present

## 2021-10-26 DIAGNOSIS — S82832D Other fracture of upper and lower end of left fibula, subsequent encounter for closed fracture with routine healing: Secondary | ICD-10-CM | POA: Diagnosis not present

## 2021-10-26 DIAGNOSIS — I5033 Acute on chronic diastolic (congestive) heart failure: Secondary | ICD-10-CM | POA: Diagnosis not present

## 2021-10-26 DIAGNOSIS — E1122 Type 2 diabetes mellitus with diabetic chronic kidney disease: Secondary | ICD-10-CM | POA: Diagnosis not present

## 2021-10-26 DIAGNOSIS — S92309A Fracture of unspecified metatarsal bone(s), unspecified foot, initial encounter for closed fracture: Secondary | ICD-10-CM | POA: Diagnosis not present

## 2021-10-26 DIAGNOSIS — Z79899 Other long term (current) drug therapy: Secondary | ICD-10-CM | POA: Diagnosis not present

## 2021-10-26 DIAGNOSIS — N400 Enlarged prostate without lower urinary tract symptoms: Secondary | ICD-10-CM | POA: Diagnosis not present

## 2021-10-26 DIAGNOSIS — Z1331 Encounter for screening for depression: Secondary | ICD-10-CM | POA: Diagnosis not present

## 2021-10-26 DIAGNOSIS — I959 Hypotension, unspecified: Secondary | ICD-10-CM | POA: Diagnosis not present

## 2021-11-01 DIAGNOSIS — R5381 Other malaise: Secondary | ICD-10-CM | POA: Diagnosis not present

## 2021-11-01 DIAGNOSIS — I5032 Chronic diastolic (congestive) heart failure: Secondary | ICD-10-CM | POA: Diagnosis not present

## 2021-11-01 DIAGNOSIS — N183 Chronic kidney disease, stage 3 unspecified: Secondary | ICD-10-CM | POA: Diagnosis not present

## 2021-11-01 DIAGNOSIS — S92309A Fracture of unspecified metatarsal bone(s), unspecified foot, initial encounter for closed fracture: Secondary | ICD-10-CM | POA: Diagnosis not present

## 2021-11-02 DIAGNOSIS — I10 Essential (primary) hypertension: Secondary | ICD-10-CM | POA: Diagnosis not present

## 2021-11-17 DIAGNOSIS — L97429 Non-pressure chronic ulcer of left heel and midfoot with unspecified severity: Secondary | ICD-10-CM | POA: Diagnosis not present

## 2021-11-21 DIAGNOSIS — R5381 Other malaise: Secondary | ICD-10-CM | POA: Diagnosis not present

## 2021-11-21 DIAGNOSIS — R5383 Other fatigue: Secondary | ICD-10-CM | POA: Diagnosis not present

## 2021-11-21 DIAGNOSIS — E860 Dehydration: Secondary | ICD-10-CM | POA: Diagnosis not present

## 2021-11-22 DIAGNOSIS — S82832D Other fracture of upper and lower end of left fibula, subsequent encounter for closed fracture with routine healing: Secondary | ICD-10-CM | POA: Diagnosis not present

## 2021-11-22 DIAGNOSIS — S82872D Displaced pilon fracture of left tibia, subsequent encounter for closed fracture with routine healing: Secondary | ICD-10-CM | POA: Diagnosis not present

## 2021-11-22 DIAGNOSIS — S92302D Fracture of unspecified metatarsal bone(s), left foot, subsequent encounter for fracture with routine healing: Secondary | ICD-10-CM | POA: Diagnosis not present

## 2021-11-24 DIAGNOSIS — L97429 Non-pressure chronic ulcer of left heel and midfoot with unspecified severity: Secondary | ICD-10-CM | POA: Diagnosis not present

## 2021-11-29 DIAGNOSIS — U071 COVID-19: Secondary | ICD-10-CM | POA: Diagnosis not present

## 2021-11-29 DIAGNOSIS — R0989 Other specified symptoms and signs involving the circulatory and respiratory systems: Secondary | ICD-10-CM | POA: Diagnosis not present

## 2021-12-05 DIAGNOSIS — I5032 Chronic diastolic (congestive) heart failure: Secondary | ICD-10-CM | POA: Diagnosis not present

## 2021-12-05 DIAGNOSIS — E669 Obesity, unspecified: Secondary | ICD-10-CM | POA: Diagnosis not present

## 2021-12-05 DIAGNOSIS — E1169 Type 2 diabetes mellitus with other specified complication: Secondary | ICD-10-CM | POA: Diagnosis not present

## 2021-12-05 DIAGNOSIS — N183 Chronic kidney disease, stage 3 unspecified: Secondary | ICD-10-CM | POA: Diagnosis not present

## 2021-12-12 DIAGNOSIS — R0981 Nasal congestion: Secondary | ICD-10-CM | POA: Diagnosis not present

## 2021-12-13 DIAGNOSIS — R410 Disorientation, unspecified: Secondary | ICD-10-CM | POA: Diagnosis not present

## 2021-12-13 DIAGNOSIS — F331 Major depressive disorder, recurrent, moderate: Secondary | ICD-10-CM | POA: Diagnosis not present

## 2021-12-13 DIAGNOSIS — F322 Major depressive disorder, single episode, severe without psychotic features: Secondary | ICD-10-CM | POA: Diagnosis not present

## 2021-12-13 DIAGNOSIS — Z1331 Encounter for screening for depression: Secondary | ICD-10-CM | POA: Diagnosis not present

## 2021-12-20 DIAGNOSIS — S82832D Other fracture of upper and lower end of left fibula, subsequent encounter for closed fracture with routine healing: Secondary | ICD-10-CM | POA: Diagnosis not present

## 2021-12-20 DIAGNOSIS — S92302D Fracture of unspecified metatarsal bone(s), left foot, subsequent encounter for fracture with routine healing: Secondary | ICD-10-CM | POA: Diagnosis not present

## 2021-12-20 DIAGNOSIS — S82872D Displaced pilon fracture of left tibia, subsequent encounter for closed fracture with routine healing: Secondary | ICD-10-CM | POA: Diagnosis not present

## 2021-12-26 DIAGNOSIS — E669 Obesity, unspecified: Secondary | ICD-10-CM | POA: Diagnosis not present

## 2021-12-26 DIAGNOSIS — I5032 Chronic diastolic (congestive) heart failure: Secondary | ICD-10-CM | POA: Diagnosis not present

## 2021-12-26 DIAGNOSIS — R5381 Other malaise: Secondary | ICD-10-CM | POA: Diagnosis not present

## 2021-12-26 DIAGNOSIS — E1169 Type 2 diabetes mellitus with other specified complication: Secondary | ICD-10-CM | POA: Diagnosis not present

## 2021-12-30 DIAGNOSIS — R55 Syncope and collapse: Secondary | ICD-10-CM | POA: Diagnosis not present

## 2021-12-30 DIAGNOSIS — E872 Acidosis, unspecified: Secondary | ICD-10-CM | POA: Diagnosis not present

## 2021-12-30 DIAGNOSIS — N179 Acute kidney failure, unspecified: Secondary | ICD-10-CM | POA: Diagnosis not present

## 2021-12-30 DIAGNOSIS — I959 Hypotension, unspecified: Secondary | ICD-10-CM | POA: Diagnosis not present

## 2021-12-30 DIAGNOSIS — A419 Sepsis, unspecified organism: Secondary | ICD-10-CM | POA: Diagnosis not present

## 2021-12-30 DIAGNOSIS — R739 Hyperglycemia, unspecified: Secondary | ICD-10-CM | POA: Diagnosis not present

## 2021-12-30 DIAGNOSIS — W19XXXA Unspecified fall, initial encounter: Secondary | ICD-10-CM | POA: Diagnosis not present

## 2021-12-30 DIAGNOSIS — F4320 Adjustment disorder, unspecified: Secondary | ICD-10-CM | POA: Diagnosis not present

## 2021-12-30 DIAGNOSIS — U071 COVID-19: Secondary | ICD-10-CM | POA: Diagnosis not present

## 2021-12-30 DIAGNOSIS — E860 Dehydration: Secondary | ICD-10-CM | POA: Diagnosis not present

## 2021-12-30 DIAGNOSIS — R531 Weakness: Secondary | ICD-10-CM | POA: Diagnosis not present

## 2022-01-03 DIAGNOSIS — K3189 Other diseases of stomach and duodenum: Secondary | ICD-10-CM | POA: Diagnosis not present

## 2022-01-04 DIAGNOSIS — I509 Heart failure, unspecified: Secondary | ICD-10-CM | POA: Diagnosis not present

## 2022-01-04 DIAGNOSIS — I130 Hypertensive heart and chronic kidney disease with heart failure and stage 1 through stage 4 chronic kidney disease, or unspecified chronic kidney disease: Secondary | ICD-10-CM | POA: Diagnosis not present

## 2022-01-04 DIAGNOSIS — N189 Chronic kidney disease, unspecified: Secondary | ICD-10-CM | POA: Diagnosis not present

## 2022-01-04 DIAGNOSIS — E1122 Type 2 diabetes mellitus with diabetic chronic kidney disease: Secondary | ICD-10-CM | POA: Diagnosis not present

## 2022-01-19 DIAGNOSIS — R5383 Other fatigue: Secondary | ICD-10-CM | POA: Diagnosis not present

## 2022-01-19 DIAGNOSIS — R4182 Altered mental status, unspecified: Secondary | ICD-10-CM | POA: Diagnosis not present

## 2022-01-19 DIAGNOSIS — R001 Bradycardia, unspecified: Secondary | ICD-10-CM | POA: Diagnosis not present

## 2022-01-19 DIAGNOSIS — I959 Hypotension, unspecified: Secondary | ICD-10-CM | POA: Diagnosis not present

## 2022-01-19 DIAGNOSIS — G4730 Sleep apnea, unspecified: Secondary | ICD-10-CM | POA: Diagnosis not present

## 2022-01-19 DIAGNOSIS — R0902 Hypoxemia: Secondary | ICD-10-CM | POA: Diagnosis not present

## 2022-01-19 DIAGNOSIS — A048 Other specified bacterial intestinal infections: Secondary | ICD-10-CM | POA: Diagnosis not present

## 2022-01-19 DIAGNOSIS — R531 Weakness: Secondary | ICD-10-CM | POA: Diagnosis not present

## 2022-01-19 DIAGNOSIS — A419 Sepsis, unspecified organism: Secondary | ICD-10-CM | POA: Diagnosis not present

## 2022-01-19 DIAGNOSIS — R7989 Other specified abnormal findings of blood chemistry: Secondary | ICD-10-CM | POA: Diagnosis not present

## 2022-01-20 DIAGNOSIS — R55 Syncope and collapse: Secondary | ICD-10-CM | POA: Diagnosis not present

## 2022-01-20 DIAGNOSIS — I509 Heart failure, unspecified: Secondary | ICD-10-CM | POA: Diagnosis not present

## 2022-01-20 DIAGNOSIS — N179 Acute kidney failure, unspecified: Secondary | ICD-10-CM | POA: Diagnosis not present

## 2022-01-20 DIAGNOSIS — I110 Hypertensive heart disease with heart failure: Secondary | ICD-10-CM | POA: Diagnosis not present

## 2022-01-20 DIAGNOSIS — I4891 Unspecified atrial fibrillation: Secondary | ICD-10-CM | POA: Diagnosis not present

## 2022-01-20 DIAGNOSIS — R930 Abnormal findings on diagnostic imaging of skull and head, not elsewhere classified: Secondary | ICD-10-CM | POA: Diagnosis not present

## 2022-01-20 DIAGNOSIS — N1831 Chronic kidney disease, stage 3a: Secondary | ICD-10-CM | POA: Diagnosis not present

## 2022-01-20 DIAGNOSIS — R9431 Abnormal electrocardiogram [ECG] [EKG]: Secondary | ICD-10-CM | POA: Diagnosis not present

## 2022-01-20 DIAGNOSIS — R791 Abnormal coagulation profile: Secondary | ICD-10-CM | POA: Diagnosis not present

## 2022-01-21 DIAGNOSIS — I130 Hypertensive heart and chronic kidney disease with heart failure and stage 1 through stage 4 chronic kidney disease, or unspecified chronic kidney disease: Secondary | ICD-10-CM | POA: Diagnosis not present

## 2022-01-21 DIAGNOSIS — B9681 Helicobacter pylori [H. pylori] as the cause of diseases classified elsewhere: Secondary | ICD-10-CM | POA: Diagnosis not present

## 2022-01-21 DIAGNOSIS — R7989 Other specified abnormal findings of blood chemistry: Secondary | ICD-10-CM | POA: Diagnosis not present

## 2022-01-21 DIAGNOSIS — E039 Hypothyroidism, unspecified: Secondary | ICD-10-CM | POA: Diagnosis not present

## 2022-01-21 DIAGNOSIS — J90 Pleural effusion, not elsewhere classified: Secondary | ICD-10-CM | POA: Diagnosis not present

## 2022-01-21 DIAGNOSIS — N1831 Chronic kidney disease, stage 3a: Secondary | ICD-10-CM | POA: Diagnosis not present

## 2022-01-21 DIAGNOSIS — J9811 Atelectasis: Secondary | ICD-10-CM | POA: Diagnosis not present

## 2022-01-21 DIAGNOSIS — R55 Syncope and collapse: Secondary | ICD-10-CM | POA: Diagnosis not present

## 2022-01-21 DIAGNOSIS — E1122 Type 2 diabetes mellitus with diabetic chronic kidney disease: Secondary | ICD-10-CM | POA: Diagnosis not present

## 2022-01-21 DIAGNOSIS — I509 Heart failure, unspecified: Secondary | ICD-10-CM | POA: Diagnosis not present

## 2022-01-21 DIAGNOSIS — N400 Enlarged prostate without lower urinary tract symptoms: Secondary | ICD-10-CM | POA: Diagnosis not present

## 2022-01-21 DIAGNOSIS — I4891 Unspecified atrial fibrillation: Secondary | ICD-10-CM | POA: Diagnosis not present

## 2022-01-22 DIAGNOSIS — I130 Hypertensive heart and chronic kidney disease with heart failure and stage 1 through stage 4 chronic kidney disease, or unspecified chronic kidney disease: Secondary | ICD-10-CM | POA: Diagnosis not present

## 2022-01-22 DIAGNOSIS — I509 Heart failure, unspecified: Secondary | ICD-10-CM | POA: Diagnosis not present

## 2022-01-22 DIAGNOSIS — N400 Enlarged prostate without lower urinary tract symptoms: Secondary | ICD-10-CM | POA: Diagnosis not present

## 2022-01-22 DIAGNOSIS — I4891 Unspecified atrial fibrillation: Secondary | ICD-10-CM | POA: Diagnosis not present

## 2022-01-22 DIAGNOSIS — R4182 Altered mental status, unspecified: Secondary | ICD-10-CM | POA: Diagnosis not present

## 2022-01-22 DIAGNOSIS — N1831 Chronic kidney disease, stage 3a: Secondary | ICD-10-CM | POA: Diagnosis not present

## 2022-01-22 DIAGNOSIS — K2270 Barrett's esophagus without dysplasia: Secondary | ICD-10-CM | POA: Diagnosis not present

## 2022-01-22 DIAGNOSIS — E1122 Type 2 diabetes mellitus with diabetic chronic kidney disease: Secondary | ICD-10-CM | POA: Diagnosis not present

## 2022-01-22 DIAGNOSIS — E118 Type 2 diabetes mellitus with unspecified complications: Secondary | ICD-10-CM | POA: Diagnosis not present

## 2022-01-22 DIAGNOSIS — R55 Syncope and collapse: Secondary | ICD-10-CM | POA: Diagnosis not present

## 2022-01-31 DIAGNOSIS — K2270 Barrett's esophagus without dysplasia: Secondary | ICD-10-CM | POA: Diagnosis not present

## 2022-01-31 DIAGNOSIS — I130 Hypertensive heart and chronic kidney disease with heart failure and stage 1 through stage 4 chronic kidney disease, or unspecified chronic kidney disease: Secondary | ICD-10-CM | POA: Diagnosis not present

## 2022-01-31 DIAGNOSIS — R410 Disorientation, unspecified: Secondary | ICD-10-CM | POA: Diagnosis not present

## 2022-01-31 DIAGNOSIS — I4891 Unspecified atrial fibrillation: Secondary | ICD-10-CM | POA: Diagnosis not present

## 2022-01-31 DIAGNOSIS — N1831 Chronic kidney disease, stage 3a: Secondary | ICD-10-CM | POA: Diagnosis not present

## 2022-01-31 DIAGNOSIS — E1122 Type 2 diabetes mellitus with diabetic chronic kidney disease: Secondary | ICD-10-CM | POA: Diagnosis not present

## 2022-01-31 DIAGNOSIS — I951 Orthostatic hypotension: Secondary | ICD-10-CM | POA: Diagnosis not present

## 2022-01-31 DIAGNOSIS — Z9181 History of falling: Secondary | ICD-10-CM | POA: Diagnosis not present

## 2022-01-31 DIAGNOSIS — I509 Heart failure, unspecified: Secondary | ICD-10-CM | POA: Diagnosis not present

## 2022-02-10 DIAGNOSIS — I130 Hypertensive heart and chronic kidney disease with heart failure and stage 1 through stage 4 chronic kidney disease, or unspecified chronic kidney disease: Secondary | ICD-10-CM | POA: Diagnosis not present

## 2022-02-10 DIAGNOSIS — E1122 Type 2 diabetes mellitus with diabetic chronic kidney disease: Secondary | ICD-10-CM | POA: Diagnosis not present

## 2022-02-10 DIAGNOSIS — R410 Disorientation, unspecified: Secondary | ICD-10-CM | POA: Diagnosis not present

## 2022-02-10 DIAGNOSIS — Z9181 History of falling: Secondary | ICD-10-CM | POA: Diagnosis not present

## 2022-02-10 DIAGNOSIS — I951 Orthostatic hypotension: Secondary | ICD-10-CM | POA: Diagnosis not present

## 2022-02-10 DIAGNOSIS — I509 Heart failure, unspecified: Secondary | ICD-10-CM | POA: Diagnosis not present

## 2022-02-10 DIAGNOSIS — I4891 Unspecified atrial fibrillation: Secondary | ICD-10-CM | POA: Diagnosis not present

## 2022-02-10 DIAGNOSIS — N1831 Chronic kidney disease, stage 3a: Secondary | ICD-10-CM | POA: Diagnosis not present

## 2022-02-10 DIAGNOSIS — K2270 Barrett's esophagus without dysplasia: Secondary | ICD-10-CM | POA: Diagnosis not present

## 2022-05-19 DIAGNOSIS — R300 Dysuria: Secondary | ICD-10-CM | POA: Diagnosis not present

## 2022-06-30 DIAGNOSIS — E1122 Type 2 diabetes mellitus with diabetic chronic kidney disease: Secondary | ICD-10-CM | POA: Diagnosis not present

## 2022-06-30 DIAGNOSIS — Z6841 Body Mass Index (BMI) 40.0 and over, adult: Secondary | ICD-10-CM | POA: Diagnosis not present

## 2022-10-17 IMAGING — MR MR LUMBAR SPINE WO/W CM
4 of 7 series · 24 of 48 positions shown · IV contrast (gadavist)
Comparison: 10/21/2020; correlation made with PET-CT 12/14/2020

CLINICAL DATA: Follow-up L4 lesion

EXAM:
MRI LUMBAR SPINE WITHOUT AND WITH CONTRAST
TECHNIQUE: Multiplanar and multiecho pulse sequences of the lumbar spine were
obtained without and with intravenous contrast.
CONTRAST:  10mL GADAVIST GADOBUTROL 1 MMOL/ML IV SOLN

[Series 5: T2 · sagittal · 4.0mm · 0.76mm/px · 5 of 17 slices shown (1 of 2)]
[im 1/17]
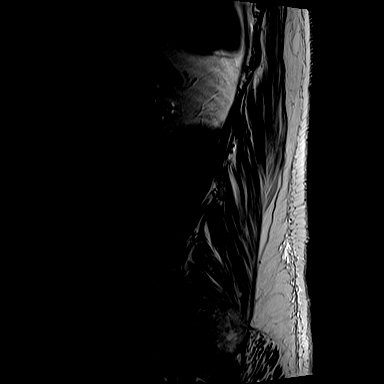
[im 5/17]
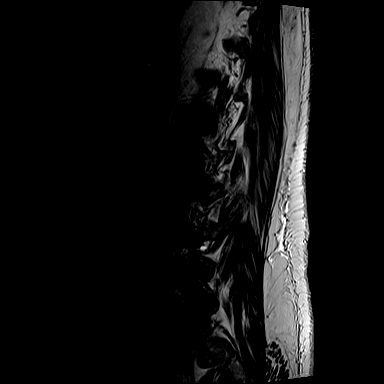
[im 9/17]
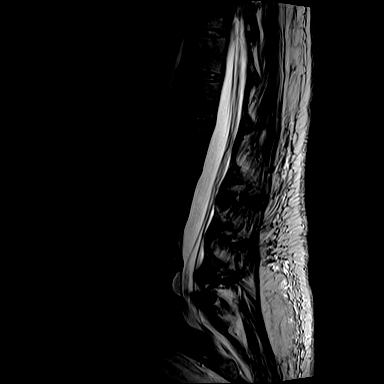
[im 13/17]
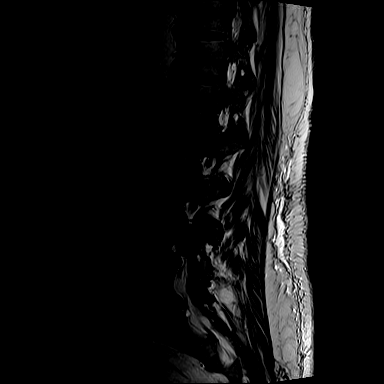
[im 17/17]
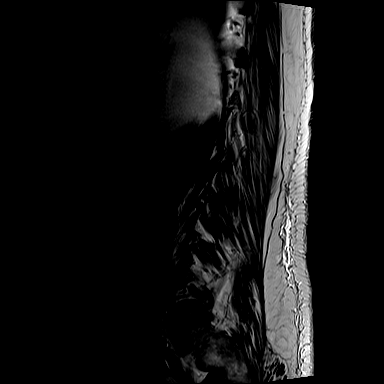

[Series 7: T1 · sagittal · 4.0mm · 0.88mm/px · 4 of 17 slices shown (1 of 2)]
[im 1/17]
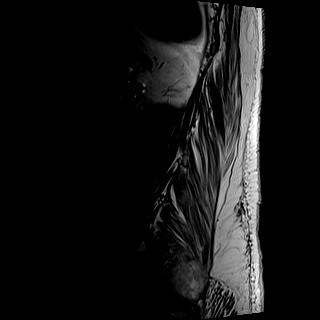
[im 6/17]
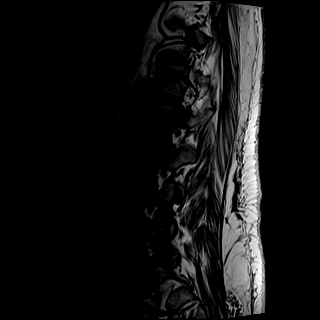
[im 11/17]
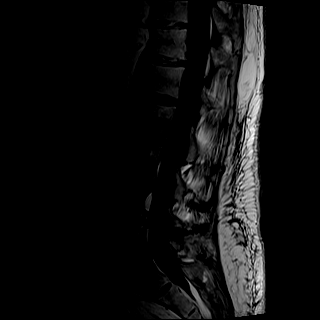
[im 17/17]
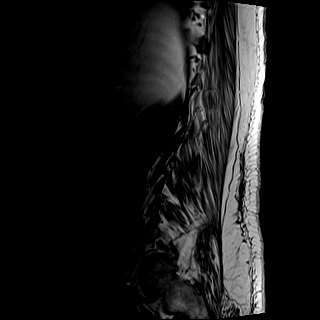

[Series 8: T2 · axial · 4.0mm · 0.57mm/px · z∈[-128,+111]mm · 8 of 41 slices shown (2 of 2)]
[im 1/41]
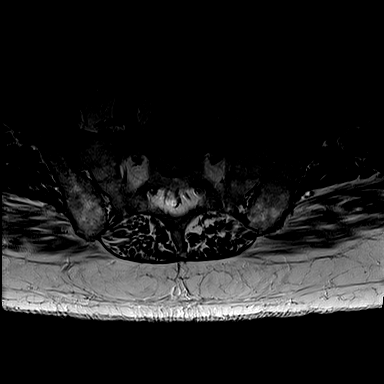
[im 5/41]
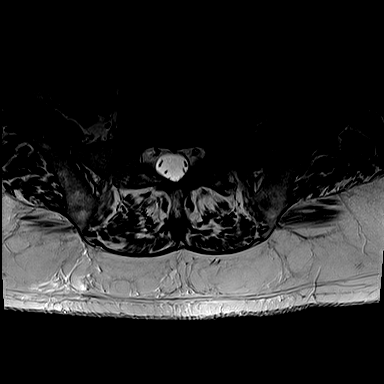
[im 14/41]
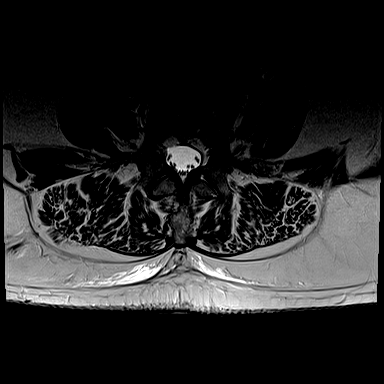
[im 18/41]
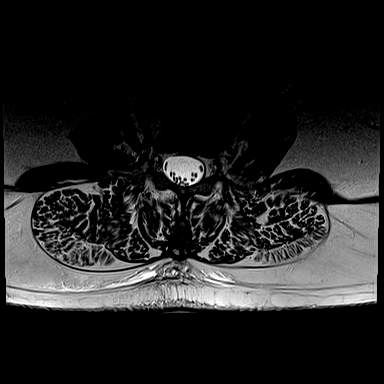
[im 23/41]
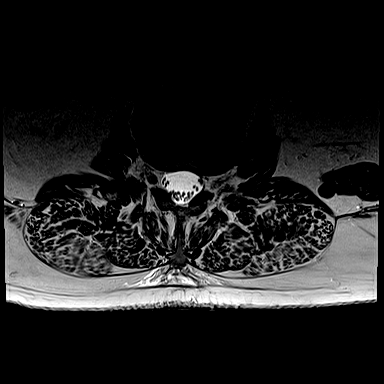
[im 27/41]
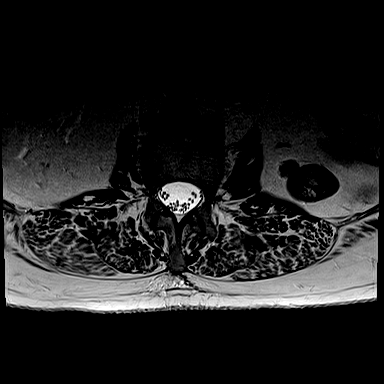
[im 36/41]
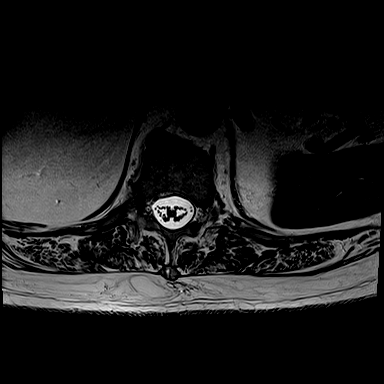
[im 41/41]
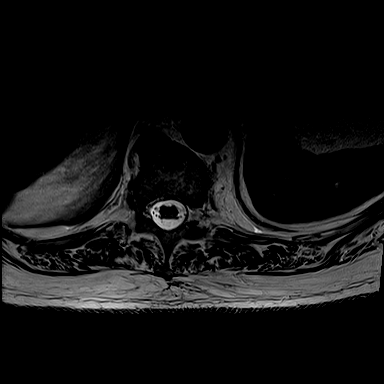

[Series 9: T1 · axial · 4.0mm · 0.34mm/px · z∈[-128,+81]mm · 7 of 41 slices shown (2 of 2)]
[im 1/41]
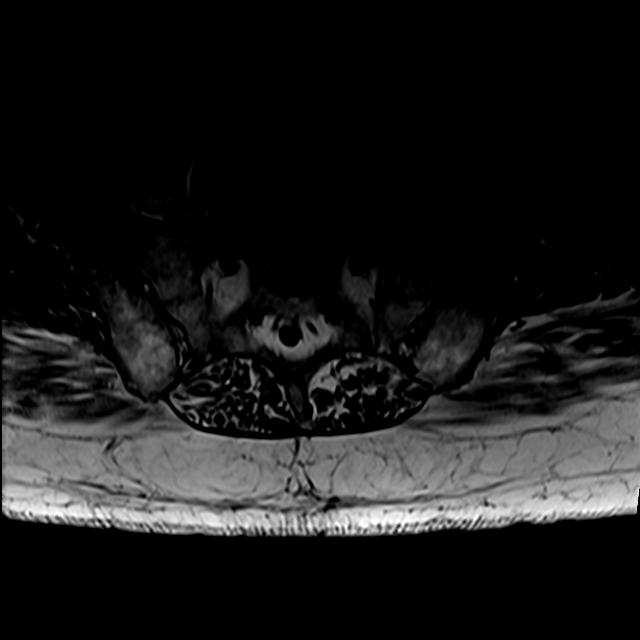
[im 5/41]
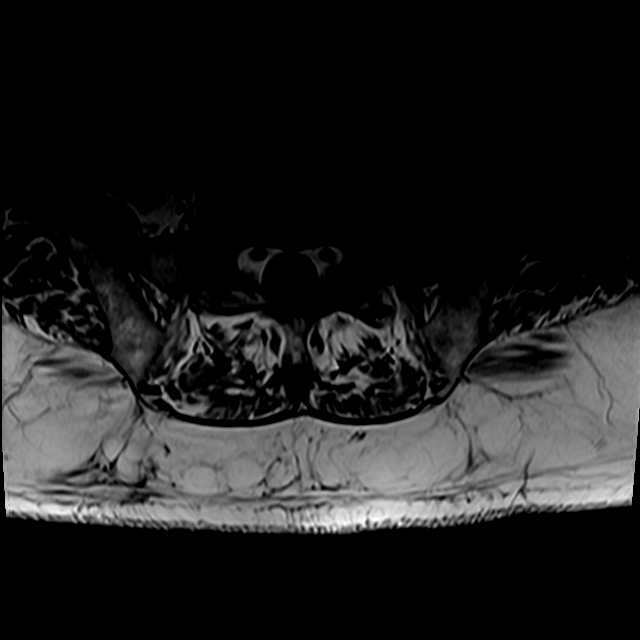
[im 14/41]
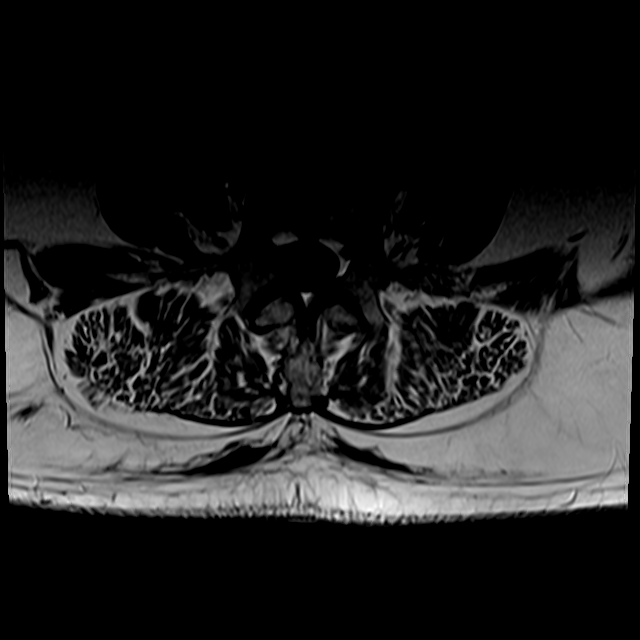
[im 18/41]
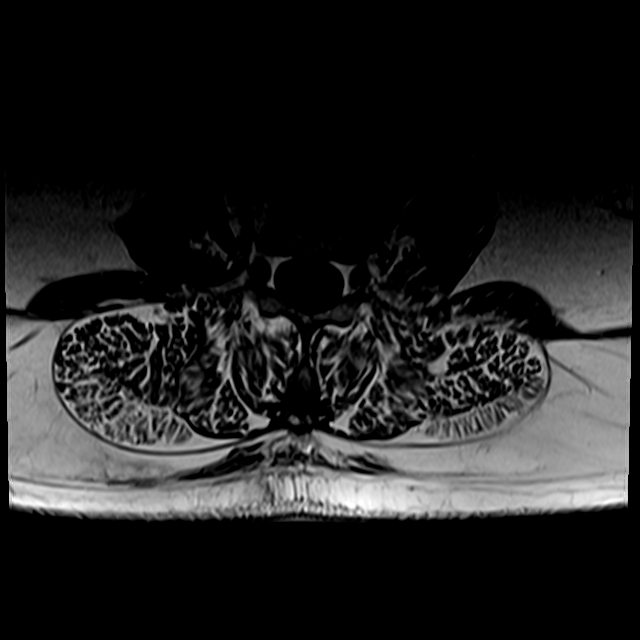
[im 23/41]
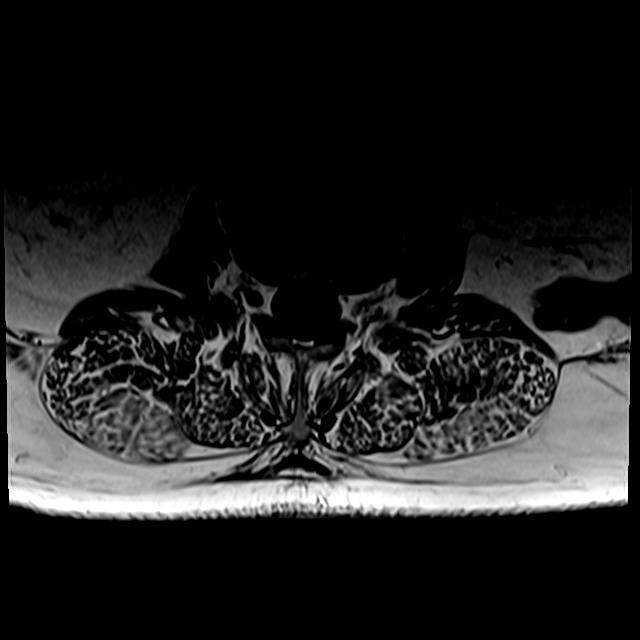
[im 27/41]
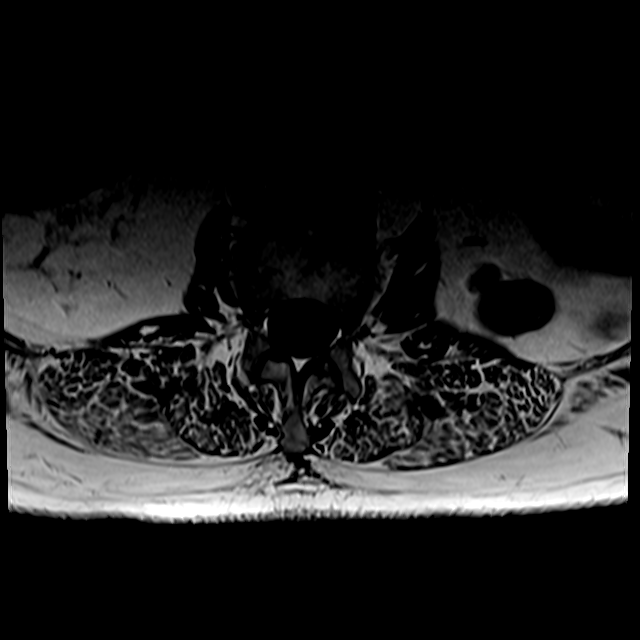
[im 36/41]
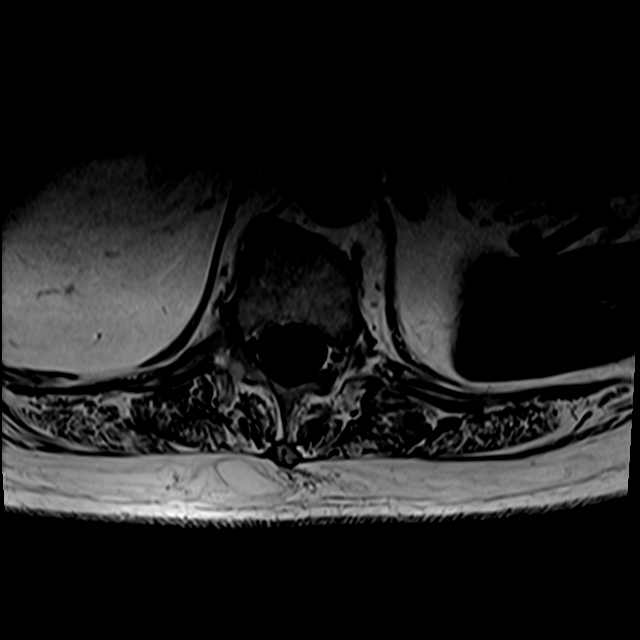

[24 of 48 positions shown; findings below may reference images not displayed]

FINDINGS: Segmentation:  Standard.

Alignment:  Stable.

Vertebrae: Stable vertebral body heights. Degenerative endplate
irregularity at L4-L5. No new marrow edema. Low signal intensity
rounded lesion within the L4 vertebral body is again identified.
There may be subtle enhancement, but this is difficult to evaluate
due to motion degradation through this area.

Conus medullaris and cauda equina: Conus extends to the T12-L1
level. Conus and cauda equina appear normal.

Paraspinal and other soft tissues: Left kidney appears to be absent.

Disc levels:

L1-L2:  No canal or foraminal stenosis.

L2-L3:  Minimal disc bulge.  No canal or foraminal stenosis.

L3-L4: Minimal disc bulge. Mild facet arthropathy. No canal or
foraminal stenosis.

L4-L5: Disc bulge with endplate osteophytic ridging. Moderate facet
arthropathy with ligamentum flavum infolding. Minor canal stenosis.
Slight effacement of the left subarticular recess. Mild to moderate
right and mild left foraminal stenosis.

L5-S1: Disc bulge slightly eccentric to the left with endplate
osteophytic ridging. Mild right and moderate left facet arthropathy.
No canal stenosis. Slight effacement of the left subarticular
recess. No right foraminal stenosis. Mild left foraminal stenosis.
IMPRESSION: Unchanged, likely benign sclerotic lesion of L4. No new or
progressive findings.

## 2022-10-27 DIAGNOSIS — A419 Sepsis, unspecified organism: Secondary | ICD-10-CM | POA: Diagnosis not present

## 2022-10-27 DIAGNOSIS — R6520 Severe sepsis without septic shock: Secondary | ICD-10-CM | POA: Diagnosis not present

## 2022-10-27 DIAGNOSIS — R509 Fever, unspecified: Secondary | ICD-10-CM | POA: Diagnosis not present

## 2022-10-27 DIAGNOSIS — I5032 Chronic diastolic (congestive) heart failure: Secondary | ICD-10-CM | POA: Diagnosis not present

## 2022-10-27 DIAGNOSIS — I872 Venous insufficiency (chronic) (peripheral): Secondary | ICD-10-CM | POA: Diagnosis not present

## 2022-10-27 DIAGNOSIS — E119 Type 2 diabetes mellitus without complications: Secondary | ICD-10-CM | POA: Diagnosis not present

## 2022-10-27 DIAGNOSIS — I4891 Unspecified atrial fibrillation: Secondary | ICD-10-CM | POA: Diagnosis not present

## 2022-10-27 DIAGNOSIS — R748 Abnormal levels of other serum enzymes: Secondary | ICD-10-CM | POA: Diagnosis not present

## 2022-10-27 DIAGNOSIS — K8020 Calculus of gallbladder without cholecystitis without obstruction: Secondary | ICD-10-CM | POA: Diagnosis not present

## 2022-10-27 DIAGNOSIS — K5730 Diverticulosis of large intestine without perforation or abscess without bleeding: Secondary | ICD-10-CM | POA: Diagnosis not present

## 2022-10-27 DIAGNOSIS — N189 Chronic kidney disease, unspecified: Secondary | ICD-10-CM | POA: Diagnosis not present

## 2022-10-27 DIAGNOSIS — R7989 Other specified abnormal findings of blood chemistry: Secondary | ICD-10-CM | POA: Diagnosis not present

## 2022-10-27 DIAGNOSIS — K2270 Barrett's esophagus without dysplasia: Secondary | ICD-10-CM | POA: Diagnosis not present

## 2022-10-27 DIAGNOSIS — D72829 Elevated white blood cell count, unspecified: Secondary | ICD-10-CM | POA: Diagnosis not present

## 2022-10-27 DIAGNOSIS — N179 Acute kidney failure, unspecified: Secondary | ICD-10-CM | POA: Diagnosis not present

## 2022-10-27 DIAGNOSIS — I700 Atherosclerosis of aorta: Secondary | ICD-10-CM | POA: Diagnosis not present

## 2022-10-28 DIAGNOSIS — R7989 Other specified abnormal findings of blood chemistry: Secondary | ICD-10-CM | POA: Diagnosis not present

## 2022-10-28 DIAGNOSIS — I5032 Chronic diastolic (congestive) heart failure: Secondary | ICD-10-CM | POA: Diagnosis not present

## 2022-10-28 DIAGNOSIS — I872 Venous insufficiency (chronic) (peripheral): Secondary | ICD-10-CM | POA: Diagnosis not present

## 2022-10-28 DIAGNOSIS — E119 Type 2 diabetes mellitus without complications: Secondary | ICD-10-CM | POA: Diagnosis not present

## 2022-10-28 DIAGNOSIS — N179 Acute kidney failure, unspecified: Secondary | ICD-10-CM | POA: Diagnosis not present

## 2022-10-28 DIAGNOSIS — N400 Enlarged prostate without lower urinary tract symptoms: Secondary | ICD-10-CM | POA: Diagnosis not present

## 2022-10-28 DIAGNOSIS — A419 Sepsis, unspecified organism: Secondary | ICD-10-CM | POA: Diagnosis not present

## 2022-10-28 DIAGNOSIS — I110 Hypertensive heart disease with heart failure: Secondary | ICD-10-CM | POA: Diagnosis not present

## 2022-10-28 DIAGNOSIS — I4891 Unspecified atrial fibrillation: Secondary | ICD-10-CM | POA: Diagnosis not present

## 2022-10-29 DIAGNOSIS — R062 Wheezing: Secondary | ICD-10-CM | POA: Diagnosis not present

## 2022-10-29 DIAGNOSIS — N179 Acute kidney failure, unspecified: Secondary | ICD-10-CM | POA: Diagnosis not present

## 2022-10-29 DIAGNOSIS — I872 Venous insufficiency (chronic) (peripheral): Secondary | ICD-10-CM | POA: Diagnosis not present

## 2022-10-29 DIAGNOSIS — I5032 Chronic diastolic (congestive) heart failure: Secondary | ICD-10-CM | POA: Diagnosis not present

## 2022-10-29 DIAGNOSIS — I110 Hypertensive heart disease with heart failure: Secondary | ICD-10-CM | POA: Diagnosis not present

## 2022-10-29 DIAGNOSIS — N400 Enlarged prostate without lower urinary tract symptoms: Secondary | ICD-10-CM | POA: Diagnosis not present

## 2022-10-29 DIAGNOSIS — M109 Gout, unspecified: Secondary | ICD-10-CM | POA: Diagnosis not present

## 2022-10-29 DIAGNOSIS — I4891 Unspecified atrial fibrillation: Secondary | ICD-10-CM | POA: Diagnosis not present

## 2022-10-29 DIAGNOSIS — E119 Type 2 diabetes mellitus without complications: Secondary | ICD-10-CM | POA: Diagnosis not present

## 2022-10-29 DIAGNOSIS — A419 Sepsis, unspecified organism: Secondary | ICD-10-CM | POA: Diagnosis not present

## 2022-10-30 DIAGNOSIS — N179 Acute kidney failure, unspecified: Secondary | ICD-10-CM | POA: Diagnosis not present

## 2022-10-30 DIAGNOSIS — A419 Sepsis, unspecified organism: Secondary | ICD-10-CM | POA: Diagnosis not present

## 2022-10-30 DIAGNOSIS — R6520 Severe sepsis without septic shock: Secondary | ICD-10-CM | POA: Diagnosis not present

## 2023-01-25 DIAGNOSIS — R350 Frequency of micturition: Secondary | ICD-10-CM | POA: Diagnosis not present

## 2023-07-31 DIAGNOSIS — E1122 Type 2 diabetes mellitus with diabetic chronic kidney disease: Secondary | ICD-10-CM | POA: Diagnosis not present

## 2023-07-31 DIAGNOSIS — I509 Heart failure, unspecified: Secondary | ICD-10-CM | POA: Diagnosis not present
